# Patient Record
Sex: Female | Born: 1990 | Race: Black or African American | Hispanic: No | Marital: Single | Smoking: Former smoker
Health system: Southern US, Community
[De-identification: ages and names within clinical notes are randomized; demographics above are authoritative.]

## PROBLEM LIST (undated history)

## (undated) DIAGNOSIS — O139 Gestational [pregnancy-induced] hypertension without significant proteinuria, unspecified trimester: Secondary | ICD-10-CM

## (undated) DIAGNOSIS — N83209 Unspecified ovarian cyst, unspecified side: Secondary | ICD-10-CM

## (undated) DIAGNOSIS — Z789 Other specified health status: Secondary | ICD-10-CM

## (undated) DIAGNOSIS — E282 Polycystic ovarian syndrome: Secondary | ICD-10-CM

## (undated) HISTORY — PX: NO PAST SURGERIES: SHX2092

---
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
## 2018-05-26 ENCOUNTER — Encounter: Payer: Self-pay | Admitting: Endocrinology

## 2018-05-28 ENCOUNTER — Other Ambulatory Visit: Payer: Self-pay | Admitting: Internal Medicine

## 2018-05-28 DIAGNOSIS — E041 Nontoxic single thyroid nodule: Secondary | ICD-10-CM

## 2018-06-07 ENCOUNTER — Ambulatory Visit
Admission: RE | Admit: 2018-06-07 | Discharge: 2018-06-07 | Disposition: A | Discharge status: Home / Self Care | Payer: BLUE CROSS/BLUE SHIELD | Source: Ambulatory Visit | Attending: Internal Medicine | Admitting: Internal Medicine

## 2018-06-07 DIAGNOSIS — E041 Nontoxic single thyroid nodule: Secondary | ICD-10-CM

## 2018-08-22 NOTE — Progress Notes (Deleted)
Patient ID: Alexandra Steele, female   DOB: 05/04/1991, 27 y.o.   MRN: [id number]

## 2018-08-23 ENCOUNTER — Ambulatory Visit: Payer: BLUE CROSS/BLUE SHIELD | Admitting: Endocrinology

## 2018-09-06 NOTE — Progress Notes (Signed)
Patient ID: Alexandra Steele, female   DOB: 1991/07/19, 27 y.o.   MRN: [id number]          Reason for Appointment: Thyroid problem, new consultation    History of Present Illness:   Patient has been referred by Dreama Saa, PA-C  The patient's thyroid enlargement was first discovered in 6/19 when she was felt to have a thyroid nodule on exam  She has had no difficulty with swallowing  Does not feel like she has tightness or pressure sensation in the neck  She also has not had any unusual weight change, palpitations, shakiness or heat intolerance  She had thyroid levels done because of symptoms of mood swings and since her TSH was below the normal range at 0.37 she has been referred here for further management Total T4 was normal at 9.9 and free T3 3.4  Lab Results  Component Value Date   FREET4 1.09 09/07/2018   TSH 0.49 09/07/2018    She has had an ultrasound exam in 6/19 which did not show any significant abnormality except mild heterogenous texture    Allergies as of 09/07/2018   No Known Allergies     Medication List        Accurate as of 09/07/18  8:59 PM. Always use your most recent med list.          levonorgestrel-ethinyl estradiol tablet Commonly known as:  ENPRESSE,TRIVORA Take 1 tablet by mouth daily.   Vitamin D (Ergocalciferol) 50000 units Caps capsule Commonly known as:  DRISDOL TAKE 1 CAPSULE BY MOUTH TWICE A WEEK WITH A MEAL       Allergies: No Known Allergies  History reviewed. No pertinent past medical history.  History reviewed. No pertinent surgical history.  Family History  Problem Relation Age of Onset  . Hypertension Mother   . Diabetes Paternal Grandmother   . Thyroid disease Neg Hx     Social History:  reports that she quit smoking about 16 months ago. She has quit using smokeless tobacco. She reports that she drinks alcohol. She reports that she has current or past drug history. Drug: Marijuana.     Review of Systems    Constitutional: Negative for weight loss and reduced appetite.  HENT: Negative for trouble swallowing.   Respiratory: Negative for shortness of breath.   Cardiovascular: Negative for leg swelling.  Gastrointestinal: Negative for constipation.  Endocrine: Negative for fatigue and heat intolerance.       She has a relatively long menstrual cycles despite being regular on her birth control pill  Musculoskeletal: Negative for joint pain.  Neurological: Negative for weakness.      Examination:   BP 124/88   Pulse 76   Ht 5' 6.5" (1.689 m)   Wt 181 lb (82.1 kg)   LMP 08/14/2018   SpO2 98%   BMI 28.78 kg/m    General Appearance: pleasant, has mild generalized obesity         Eyes: No prominence or swelling.          Neck: The thyroid is not enlarged and there is no palpable abnormality. There is no lymphadenopathy.     Cardiovascular: Normal  heart sounds, no murmur Respiratory:  Lungs clear Abdomen shows no hepatosplenomegaly or other mass Neurological: REFLEXES: at biceps are normal.  Skin: no rash        Assessment/Plan:  Mildly decreased TSH as of 05/2018 with normal T4 and T3 levels Clinically asymptomatic No thyroid enlargement is palpable Also her thyroid  ultrasound did not show any abnormal size of the thyroid or nodules  Most likely she has a low normal TSH of no significance and will need to repeat thyroid functions today to confirm  A copy of the consultation note is sent to the referring physician  Alexandra Steele 09/07/2018  ADDENDUM: TSH is 0.49, normal.  Free T4 normal Since she is asymptomatic likely she has TSH levels at the low end of the range without any clinical significance and does not need any further evaluation  Alexandra LittlerAjay Daundre Steele

## 2018-09-07 ENCOUNTER — Ambulatory Visit (INDEPENDENT_AMBULATORY_CARE_PROVIDER_SITE_OTHER): Payer: BLUE CROSS/BLUE SHIELD | Admitting: Endocrinology

## 2018-09-07 ENCOUNTER — Encounter: Payer: Self-pay | Admitting: Endocrinology

## 2018-09-07 VITALS — BP 124/88 | HR 76 | Ht 66.5 in | Wt 181.0 lb

## 2018-09-07 DIAGNOSIS — R7989 Other specified abnormal findings of blood chemistry: Secondary | ICD-10-CM | POA: Diagnosis not present

## 2018-09-07 LAB — TSH: TSH: 0.49 u[IU]/mL (ref 0.35–4.50)

## 2018-09-07 LAB — T4, FREE: Free T4: 1.09 ng/dL (ref 0.60–1.60)

## 2019-07-07 IMAGING — US US THYROID
1 series · 14 of 25 positions shown · non-contrast
Comparison: None.

CLINICAL DATA: Palpable abnormality.  Thyroid nodule.

EXAM:
THYROID ULTRASOUND
TECHNIQUE: Ultrasound examination of the thyroid gland and adjacent soft
tissues was performed.

[Series 1: us thyroid · 0.07mm/px · 14 of 37 slices shown]
[im 1/37]
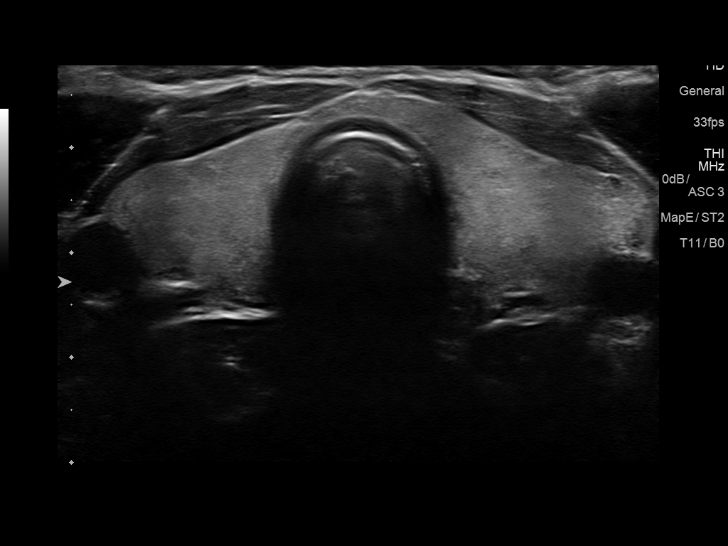
[im 4/37]
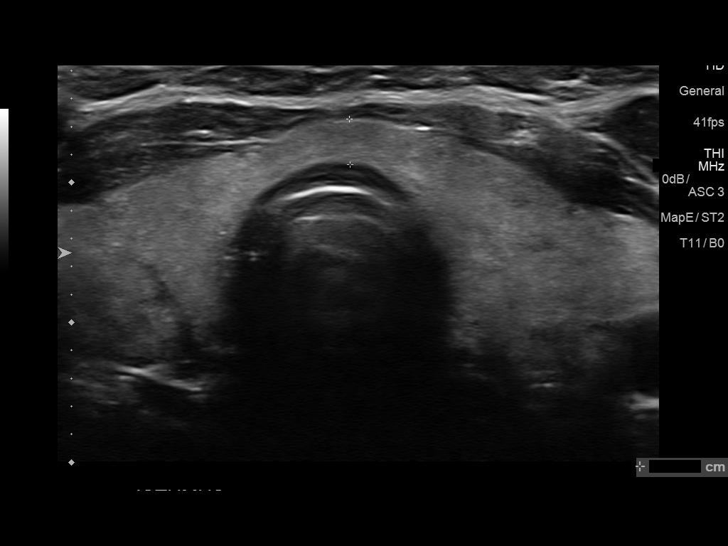
[im 7/37]
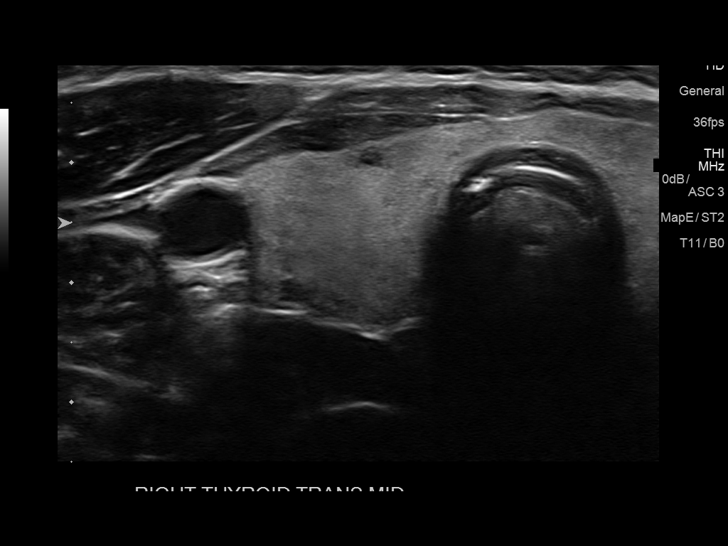
[im 10/37]
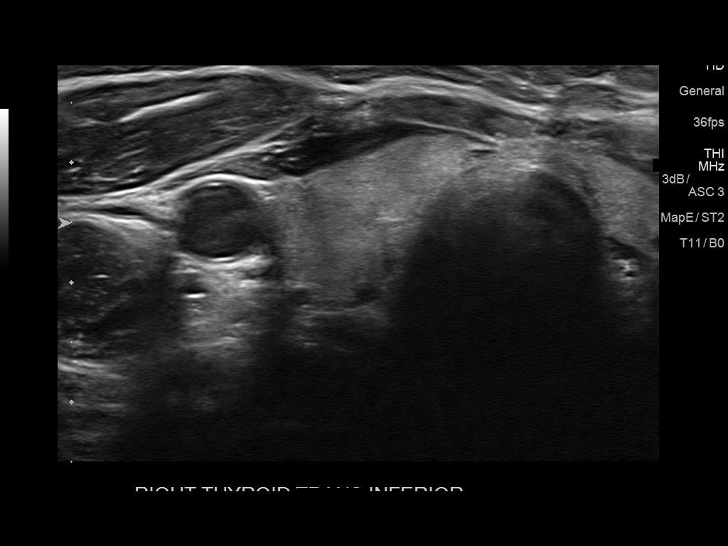
[im 13/37]
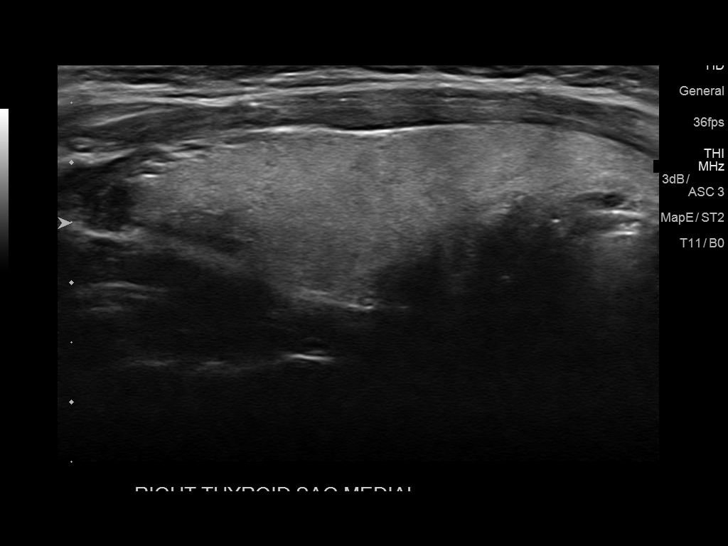
[im 14/37]
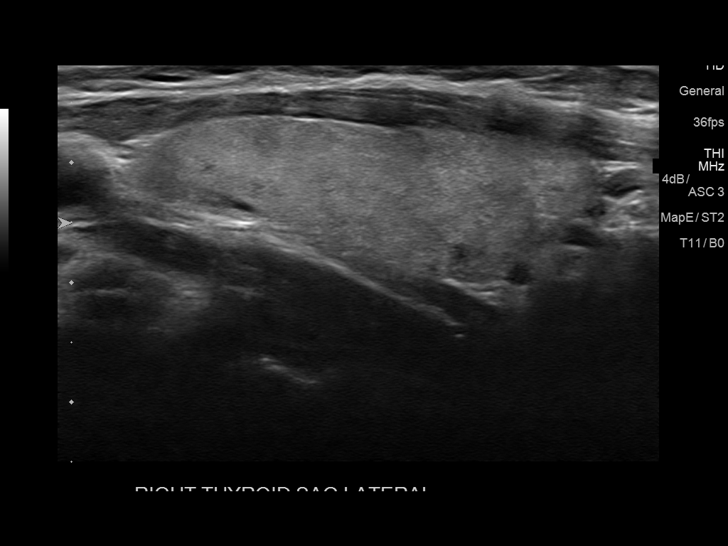
[im 17/37]
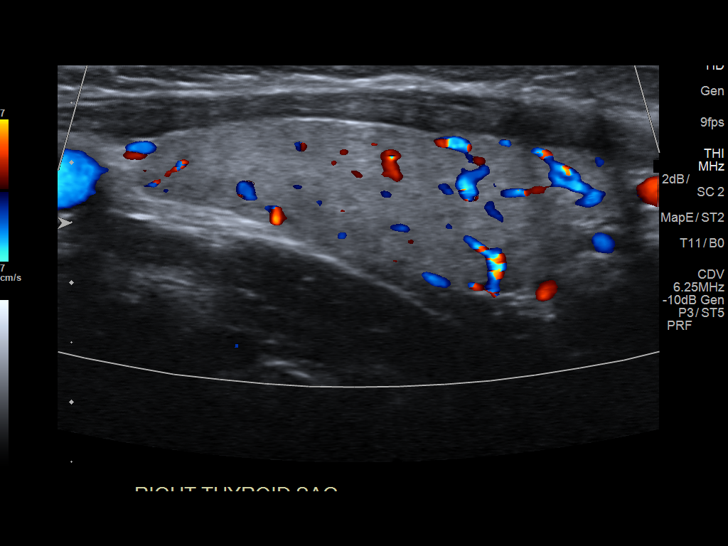
[im 20/37]
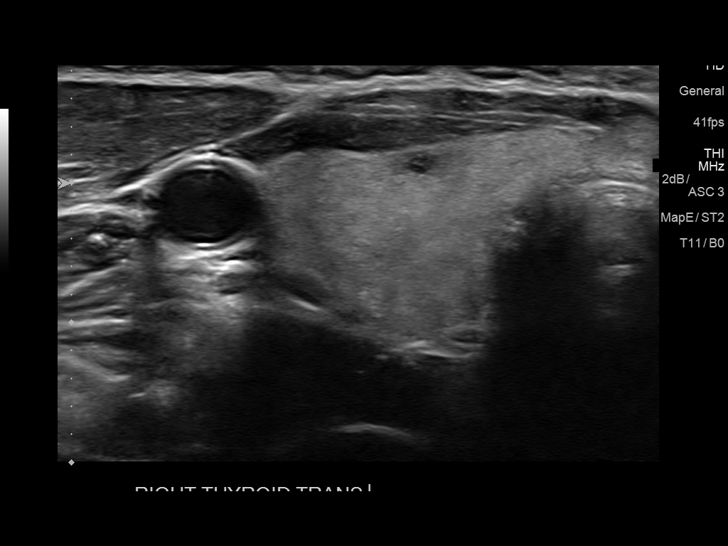
[im 23/37]
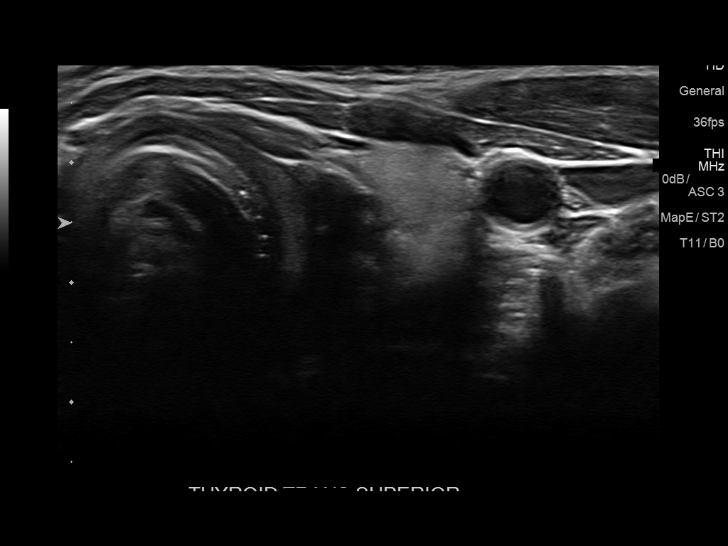
[im 25/37]
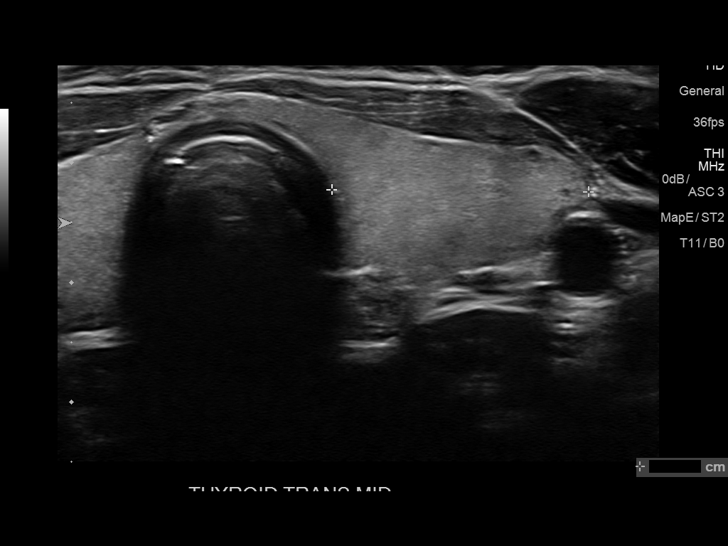
[im 28/37]
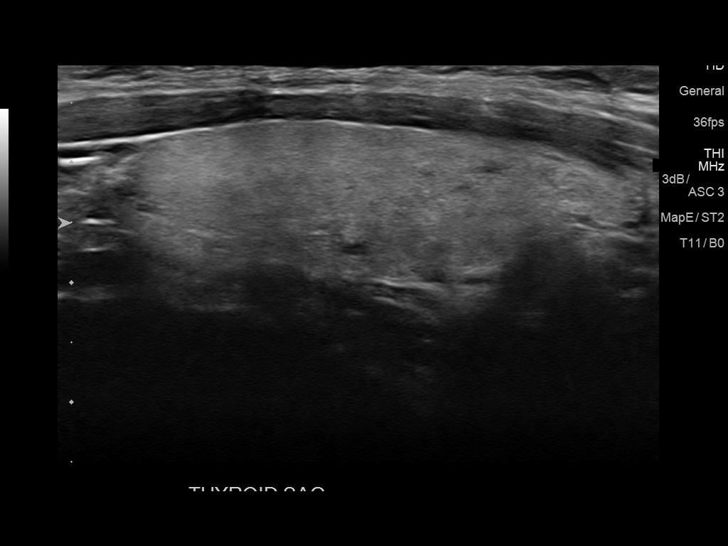
[im 31/37]
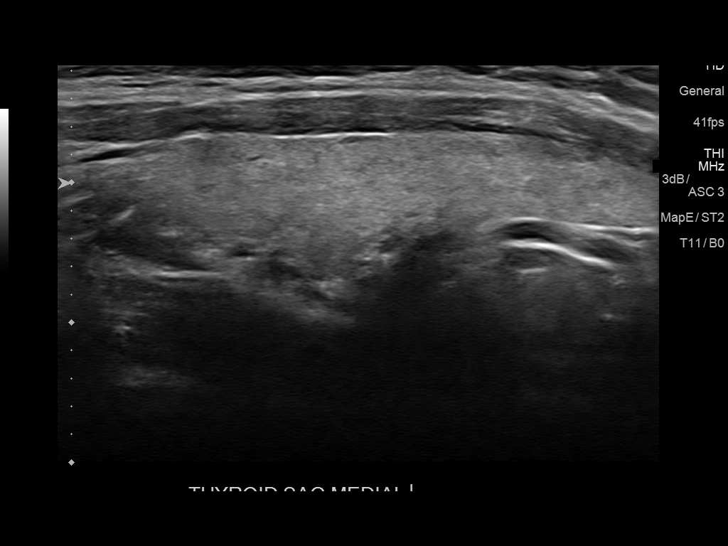
[im 34/37]
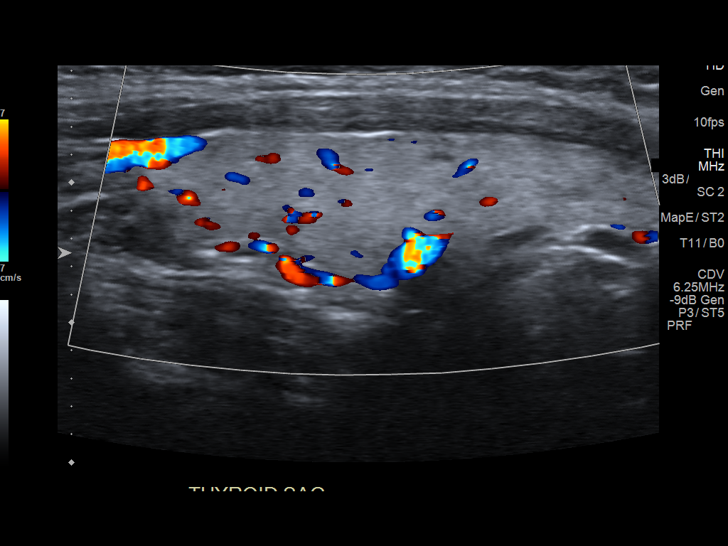
[im 37/37]
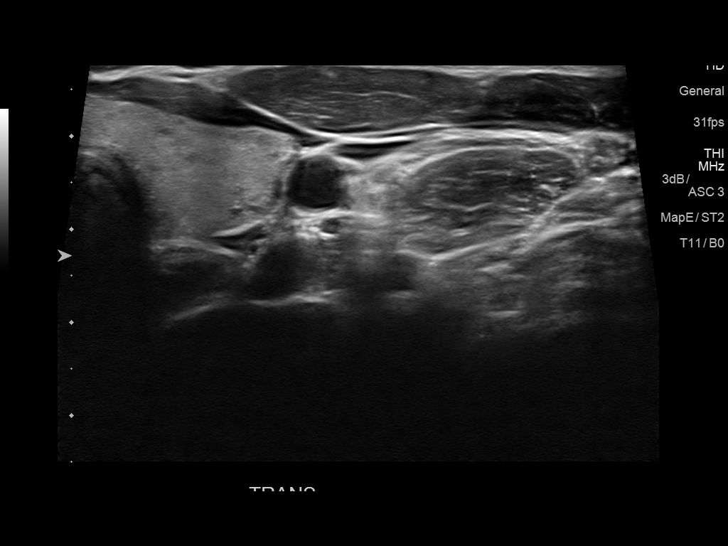

[14 of 25 positions shown; findings below may reference images not displayed]

FINDINGS: Parenchymal Echotexture: Mildly heterogenous

Isthmus: 0.3 cm

Right lobe: 4.6 x 1.7 x 1.8 cm

Left lobe: 4.3 x 1.3 x 2.1 cm

_________________________________________________________

Estimated total number of nodules >/= 1 cm: 0

Number of spongiform nodules >/=  2 cm not described below (TR1): 0

Number of mixed cystic and solid nodules >/= 1.5 cm not described
below (TR2): 0

_________________________________________________________

No discrete nodules are seen within the thyroid gland.
IMPRESSION: No discrete nodule.

The above is in keeping with the ACR TI-RADS recommendations - [HOSPITAL] 6852;[DATE].

## 2019-09-19 ENCOUNTER — Telehealth: Payer: Self-pay

## 2019-09-19 NOTE — Telephone Encounter (Signed)
Pt LVM asking for some drops to be called in for a sty on her eye that she has had for about 4 days. LVM for pt to call the office to be scheduled for an appt

## 2019-10-20 ENCOUNTER — Other Ambulatory Visit: Payer: Self-pay | Admitting: Internal Medicine

## 2019-12-28 ENCOUNTER — Other Ambulatory Visit: Payer: Self-pay | Admitting: Internal Medicine

## 2020-02-09 LAB — HM PAP SMEAR

## 2020-03-08 ENCOUNTER — Other Ambulatory Visit: Payer: Self-pay

## 2020-03-08 ENCOUNTER — Encounter: Payer: Self-pay | Admitting: Internal Medicine

## 2021-02-25 ENCOUNTER — Telehealth: Payer: Self-pay

## 2021-02-25 NOTE — Telephone Encounter (Signed)
LVM for pt to call the office to schedule an appt. °

## 2022-03-06 LAB — OB RESULTS CONSOLE RPR: RPR: NONREACTIVE

## 2022-03-06 LAB — OB RESULTS CONSOLE RUBELLA ANTIBODY, IGM: Rubella: IMMUNE

## 2022-03-06 LAB — OB RESULTS CONSOLE HEPATITIS B SURFACE ANTIGEN: Hepatitis B Surface Ag: NEGATIVE

## 2022-03-06 LAB — HEPATITIS C ANTIBODY: HCV Ab: NEGATIVE

## 2022-03-06 LAB — OB RESULTS CONSOLE HIV ANTIBODY (ROUTINE TESTING): HIV: NONREACTIVE

## 2022-03-12 LAB — OB RESULTS CONSOLE GC/CHLAMYDIA
Chlamydia: NEGATIVE
Neisseria Gonorrhea: NEGATIVE

## 2022-08-01 LAB — OB RESULTS CONSOLE GC/CHLAMYDIA
Chlamydia: NEGATIVE
Neisseria Gonorrhea: NEGATIVE

## 2022-08-01 LAB — OB RESULTS CONSOLE GBS: GBS: NEGATIVE

## 2022-08-22 ENCOUNTER — Other Ambulatory Visit: Payer: Self-pay | Admitting: Obstetrics and Gynecology

## 2022-08-22 ENCOUNTER — Ambulatory Visit: Payer: BC Managed Care – PPO | Admitting: *Deleted

## 2022-08-22 ENCOUNTER — Inpatient Hospital Stay (HOSPITAL_COMMUNITY)
Admission: AD | Admit: 2022-08-22 | Discharge: 2022-08-22 | Disposition: A | Discharge status: Home / Self Care | Payer: BC Managed Care – PPO | Source: Home / Self Care | Attending: Obstetrics and Gynecology | Admitting: Obstetrics and Gynecology

## 2022-08-22 ENCOUNTER — Ambulatory Visit (HOSPITAL_BASED_OUTPATIENT_CLINIC_OR_DEPARTMENT_OTHER): Payer: BC Managed Care – PPO

## 2022-08-22 ENCOUNTER — Other Ambulatory Visit: Payer: Self-pay

## 2022-08-22 ENCOUNTER — Ambulatory Visit (HOSPITAL_BASED_OUTPATIENT_CLINIC_OR_DEPARTMENT_OTHER): Payer: BC Managed Care – PPO | Admitting: Maternal & Fetal Medicine

## 2022-08-22 ENCOUNTER — Encounter (HOSPITAL_COMMUNITY): Payer: Self-pay | Admitting: Obstetrics and Gynecology

## 2022-08-22 VITALS — BP 140/84 | HR 66

## 2022-08-22 DIAGNOSIS — O43103 Malformation of placenta, unspecified, third trimester: Secondary | ICD-10-CM | POA: Diagnosis not present

## 2022-08-22 DIAGNOSIS — O365990 Maternal care for other known or suspected poor fetal growth, unspecified trimester, not applicable or unspecified: Secondary | ICD-10-CM

## 2022-08-22 DIAGNOSIS — Z363 Encounter for antenatal screening for malformations: Secondary | ICD-10-CM

## 2022-08-22 DIAGNOSIS — R030 Elevated blood-pressure reading, without diagnosis of hypertension: Secondary | ICD-10-CM

## 2022-08-22 DIAGNOSIS — Z3A34 34 weeks gestation of pregnancy: Secondary | ICD-10-CM

## 2022-08-22 DIAGNOSIS — O163 Unspecified maternal hypertension, third trimester: Secondary | ICD-10-CM | POA: Insufficient documentation

## 2022-08-22 DIAGNOSIS — O365930 Maternal care for other known or suspected poor fetal growth, third trimester, not applicable or unspecified: Secondary | ICD-10-CM

## 2022-08-22 DIAGNOSIS — Z3689 Encounter for other specified antenatal screening: Secondary | ICD-10-CM | POA: Insufficient documentation

## 2022-08-22 DIAGNOSIS — O283 Abnormal ultrasonic finding on antenatal screening of mother: Secondary | ICD-10-CM | POA: Insufficient documentation

## 2022-08-22 DIAGNOSIS — Z0130 Encounter for examination of blood pressure without abnormal findings: Secondary | ICD-10-CM | POA: Diagnosis not present

## 2022-08-22 HISTORY — DX: Other specified health status: Z78.9

## 2022-08-22 LAB — COMPREHENSIVE METABOLIC PANEL
ALT: 13 U/L (ref 0–44)
AST: 17 U/L (ref 15–41)
Albumin: 3 g/dL — ABNORMAL LOW (ref 3.5–5.0)
Alkaline Phosphatase: 139 U/L — ABNORMAL HIGH (ref 38–126)
Anion gap: 9 (ref 5–15)
BUN: 14 mg/dL (ref 6–20)
CO2: 20 mmol/L — ABNORMAL LOW (ref 22–32)
Calcium: 9.1 mg/dL (ref 8.9–10.3)
Chloride: 107 mmol/L (ref 98–111)
Creatinine, Ser: 0.77 mg/dL (ref 0.44–1.00)
GFR, Estimated: >60 mL/min (ref >60)
Glucose, Bld: 74 mg/dL (ref 70–99)
Potassium: 4.2 mmol/L (ref 3.5–5.1)
Sodium: 136 mmol/L (ref 135–145)
Total Bilirubin: 0.4 mg/dL (ref 0.3–1.2)
Total Protein: 6.1 g/dL — ABNORMAL LOW (ref 6.5–8.1)

## 2022-08-22 LAB — CBC
HCT: 35.3 % — ABNORMAL LOW (ref 36.0–46.0)
Hemoglobin: 11.9 g/dL — ABNORMAL LOW (ref 12.0–15.0)
MCH: 29.8 pg (ref 26.0–34.0)
MCHC: 33.7 g/dL (ref 30.0–36.0)
MCV: 88.5 fL (ref 80.0–100.0)
Platelets: 255 10*3/uL (ref 150–400)
RBC: 3.99 MIL/uL (ref 3.87–5.11)
RDW: 11.8 % (ref 11.5–15.5)
WBC: 12.7 10*3/uL — ABNORMAL HIGH (ref 4.0–10.5)
nRBC: 0.2 % (ref 0.0–0.2)

## 2022-08-22 LAB — PROTEIN / CREATININE RATIO, URINE
Creatinine, Urine: 178 mg/dL
Protein Creatinine Ratio: 0.09 mg/mg{Cre} (ref 0.00–0.15)
Total Protein, Urine: 16 mg/dL

## 2022-08-22 MED ORDER — BETAMETHASONE SOD PHOS & ACET 6 (3-3) MG/ML IJ SUSP
12.0000 mg | Freq: Once | INTRAMUSCULAR | Status: AC
  Administered 2022-08-22: 12 mg via INTRAMUSCULAR
  Filled 2022-08-22: qty 5

## 2022-08-22 NOTE — Progress Notes (Addendum)
MFM Consult Note Patient Name: Alexandra Steele  Patient MRN:   [id number]  Referring provider: [facility]  Reason for Consult: placental lesion/abruption, elevated BP and FGR   HPI: Alexandra Steele is a 31 y.o. G1P0 at [redacted]w[redacted]d by [redacted]w[redacted]d [place]. Here for ultrasound and consultation.   RE placental lesion: The placental lesion was noted on the referring facility ultrasound.  The concerning area today is suggestive of a chronic abruption.  It appears there are mixed echogenicities measuring about 4.4 x 6.6 cm.  The there is no evidence of active bleeding.  The remainder of the placenta appears heavily calcified.  I discussed the clinical significance of the placental abruption.  I also discussed this clinical diagnosis and that ultrasound is indicated the diagnosis but should not be relied on clinical decision making.  She has moderate cramping throughout the day but denies overt contractions.  She denies vaginal bleeding.  RE fetal growth restriction: The fetus is growth restricted at today with the estimated fetal weight measuring 2.4% and the abdominal circumference less than the first percentile.  Umbilical artery Dopplers are normal.  I discussed the various causes of fetal growth restriction including but not limited to genetic, infectious, placental and maternal chronic disease.  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  In addition to her elevated blood pressure I discussed the coincidence of preeclampsia and fetal growth restriction.  Discussed the importance of going to the hospital for prolonged monitoring and for blood pressure assessment and additional laboratory work.  I discussed that if she is discharged we will perform weekly ultrasounds including umbilical artery Dopplers, BPP and NST.  RE Elevated blood pressure: I discussed her new onset finding of elevated blood pressure today.  She denies headache, vision changes, right upper quadrant pain  or excessive swelling.  I discussed my concerns for possible preeclampsia versus gestational hypertension.  I discussed the management if she were to be diagnosed with either condition, specifically early delivery.  Review of Systems: A review of systems was performed and was negative except per HPI   Vitals Blood pressure: 139/86 with repeat of 140/84, Prepregnancy BMI: 23  Genetic testing: low risk   Assessment Fetal growth restriction (2.4% overall, <1% AC, normal dopplers) Possible placental abruption Elevated blood pressure Non-reassuring fetal status (BPP 6/8, -2 fetal movement)  Plan Sent to MAU for prolonged fetal monitoring, serial blood pressure assessments and preeclampsia labs. If she is discharged I have scheduled her follow-up  I spent 45 minutes reviewing the patients chart, including labs and images as well as counseling the patient about her medical conditions.  [name]  MFM, [facility]   08/22/2022  3:56 PM

## 2022-08-22 NOTE — MAU Note (Signed)
.  Alexandra Steele is a 31 y.o. at [redacted]w[redacted]d here in MAU reporting: sent over from [facility] office for NST and BP assessment. Patient reports that OB saw something abnormal on her uterus during [place] today. Paper from [facility] office says follow up for "possible abruption on [place]" and "BP assessment". Reports some lower abdominal cramping 5/10. Denies HA, visual changes, RUQ/epigastric pain, or abnormal swelling. Denies VB or LOF. Reports +FM.  LMP: N/A Onset of complaint: today Pain score: 5/10 Vitals:   08/22/22 1625  BP: (!) 153/89  Pulse: 78  Resp: 20  Temp: 98.7 F (37.1 C)  SpO2: 98%     FHT:145 Lab orders placed from triage:  UA

## 2022-08-22 NOTE — MAU Provider Note (Signed)
History     CSN: [id number]  Arrival date and time: 08/22/22 1605   Event Date/Time   First Provider Initiated Contact with Patient 08/22/22 1630      Chief Complaint  Patient presents with   Hypertension   Non-stress Test   HPI Lashone Stauber is a 31 y.o. G1P0 at [redacted]w[redacted]d who presents to MAU from MFM for evaluation of new onset elevated blood pressure. Patient's pregnancy is complicated by FGR and chronic abruption. On arrival to MAU she reports decreased fetal movement. She denies contractions, vaginal bleeding, headache, visual disturbances, RUQ/epigastric pain, new onset swelling or weight gain.  Patient receives care with [facility] and her next appointment is 09/08/2022. She is scheduled with MFM next week.  OB History     Gravida  1   Para      Term      Preterm      AB      Living         SAB      IAB      Ectopic      Multiple      Live Births              Past Medical History:  Diagnosis Date   Medical history non-contributory     Past Surgical History:  Procedure Laterality Date   NO PAST SURGERIES      Family History  Problem Relation Age of Onset   Hypertension Mother    Dementia Mother    Diabetes Paternal Grandmother    Thyroid disease Neg Hx     Social History   Tobacco Use   Smoking status: Former    Types: Cigarettes    Quit date: 05/07/2017    Years since quitting: 5.2   Smokeless tobacco: Former  Substance Use Topics   Alcohol use: Not Currently    Comment: social   Drug use: Not Currently    Types: Marijuana    Allergies: No Known Allergies  Medications Prior to Admission  Medication Sig Dispense Refill Last Dose   ferrous sulfate 325 (65 FE) MG tablet Take 325 mg by mouth daily with breakfast.   08/22/2022   ondansetron (ZOFRAN) 4 MG tablet Take 4 mg by mouth every 8 (eight) hours as needed for nausea or vomiting.   08/22/2022   prenatal vitamin w/FE, FA (PRENATAL 1 + 1) 27-1 MG TABS tablet Take by mouth daily at 12  noon.   08/22/2022   Vitamin D, Ergocalciferol, (DRISDOL) 1.25 MG (50000 UT) CAPS capsule TAKE 1 CAPSULE BY MOUTH TWICE A WEEK WITH A MEAL 24 capsule 0 08/22/2022    Review of Systems  All other systems reviewed and are negative.  Physical Exam   Blood pressure (!) 140/82, pulse 67, temperature 98.7 F (37.1 C), temperature source Oral, resp. rate 20, height 5\' 6"  (1.676 m), weight 97.4 kg, SpO2 98 %.  Physical Exam Vitals and nursing note reviewed. Exam conducted with a chaperone present.  Constitutional:      Appearance: Normal appearance. She is not ill-appearing.  Cardiovascular:     Rate and Rhythm: Normal rate and regular rhythm.     Pulses: Normal pulses.     Heart sounds: Normal heart sounds.  Pulmonary:     Effort: Pulmonary effort is normal.     Breath sounds: Normal breath sounds.  Abdominal:     Comments: Gravid  Skin:    Capillary Refill: Capillary refill takes less than 2 seconds.  Neurological:  Mental Status: She is alert and oriented to person, place, and time.  Psychiatric:        Mood and Affect: Mood normal.        Behavior: Behavior normal.        Thought Content: Thought content normal.        Judgment: Judgment normal.    MAU Course  Procedures  MDM  --EMR reviewed. No hx of HTN --First elevated reading in MFM today at 1330 --Patient does not yet have 4 hours between mildly elevated BPs. Asymptomatic throughout MAU encounter --Reactive tracing: baseline 145, mod var, + accels, no decels --BPP 6/8 in MFM today, off for fetal movement --Discussed workup and findings with Dr. Alysia Penna. He advises BMZ, agrees patient is stable for discharge home but needs earlier than 09/25 for follow-up in office.  --Dr. Su Hilt called at [phone]. She agrees with plan of care. Per Dr. Su Hilt, I will call Rhea Pink, CNM and ask for her help coordinating an office appointment this Monday 09/11 --Rhea Pink reached at [phone] to assist with office appointment  Orders  Placed This Encounter  Procedures   Protein / creatinine ratio, urine   CBC   Comprehensive metabolic panel   Measure blood pressure   Discharge patient   Patient Vitals for the past 24 hrs:  BP Temp Temp src Pulse Resp SpO2 Height Weight  08/22/22 1745 132/74 -- -- 61 -- 100 % -- --  08/22/22 1730 119/63 -- -- 67 -- 100 % -- --  08/22/22 1715 (!) 117/94 -- -- (!) 157 -- 99 % -- --  08/22/22 1700 135/78 -- -- 67 -- 98 % -- --  08/22/22 1645 (!) 142/97 -- -- 66 -- 98 % -- --  08/22/22 1630 (!) 140/82 -- -- 67 -- 98 % -- --  08/22/22 1625 (!) 153/89 98.7 F (37.1 C) Oral 78 20 98 % 5\' 6"  (1.676 m) 97.4 kg   Results for orders placed or performed during the hospital encounter of 08/22/22 (from the past 24 hour(s))  Protein / creatinine ratio, urine     Status: None   Collection Time: 08/22/22  4:40 PM  Result Value Ref Range   Creatinine, Urine 178 mg/dL   Total Protein, Urine 16 mg/dL   Protein Creatinine Ratio 0.09 0.00 - 0.15 mg/mg[Cre]  CBC     Status: Abnormal   Collection Time: 08/22/22  5:05 PM  Result Value Ref Range   WBC 12.7 (H) 4.0 - 10.5 K/uL   RBC 3.99 3.87 - 5.11 MIL/uL   Hemoglobin 11.9 (L) 12.0 - 15.0 g/dL   HCT 10/22/22 (L) 15.4 - 00.8 %   MCV 88.5 80.0 - 100.0 fL   MCH 29.8 26.0 - 34.0 pg   MCHC 33.7 30.0 - 36.0 g/dL   RDW 67.6 19.5 - 09.3 %   Platelets 255 150 - 400 K/uL   nRBC 0.2 0.0 - 0.2 %  Comprehensive metabolic panel     Status: Abnormal   Collection Time: 08/22/22  5:05 PM  Result Value Ref Range   Sodium 136 135 - 145 mmol/L   Potassium 4.2 3.5 - 5.1 mmol/L   Chloride 107 98 - 111 mmol/L   CO2 20 (L) 22 - 32 mmol/L   Glucose, Bld 74 70 - 99 mg/dL   BUN 14 6 - 20 mg/dL   Creatinine, Ser 10/22/22 0.44 - 1.00 mg/dL   Calcium 9.1 8.9 - 1.24 mg/dL   Total Protein 6.1 (L) 6.5 -  8.1 g/dL   Albumin 3.0 (L) 3.5 - 5.0 g/dL   AST 17 15 - 41 U/L   ALT 13 0 - 44 U/L   Alkaline Phosphatase 139 (H) 38 - 126 U/L   Total Bilirubin 0.4 0.3 - 1.2 mg/dL   GFR,  Estimated >17 >79 mL/min   Anion gap 9 5 - 15   Assessment and Plan  --31 y.o. G1P0 at [redacted]w[redacted]d  --FGR per MFM --New onset elevated BP not yet 4 hours between readings --Asymptomatic, PEC labs WNL --BMZ 1 of 2 given in MAU at 1648 --Care coordinated with Dr. Alysia Penna and Dr. Su Hilt --Discharge home in stable condition with return precautions  F/U: --Patient to return to MAU tomorrow 09/09 after 1630 for BMZ 2 of 2 --Per Dr. Su Hilt, patient to be worked onto office schedule Monday 09/11  Calvert Cantor, MSA, MSN, CNM

## 2022-08-23 ENCOUNTER — Inpatient Hospital Stay (EMERGENCY_DEPARTMENT_HOSPITAL)
Admission: AD | Admit: 2022-08-23 | Discharge: 2022-08-23 | Disposition: A | Discharge status: Home / Self Care | Payer: BC Managed Care – PPO | Source: Home / Self Care | Attending: Obstetrics and Gynecology | Admitting: Obstetrics and Gynecology

## 2022-08-23 DIAGNOSIS — O26893 Other specified pregnancy related conditions, third trimester: Secondary | ICD-10-CM | POA: Insufficient documentation

## 2022-08-23 DIAGNOSIS — Z0130 Encounter for examination of blood pressure without abnormal findings: Secondary | ICD-10-CM | POA: Diagnosis not present

## 2022-08-23 DIAGNOSIS — Z3A34 34 weeks gestation of pregnancy: Secondary | ICD-10-CM | POA: Insufficient documentation

## 2022-08-23 DIAGNOSIS — R42 Dizziness and giddiness: Secondary | ICD-10-CM | POA: Insufficient documentation

## 2022-08-23 MED ORDER — BETAMETHASONE SOD PHOS & ACET 6 (3-3) MG/ML IJ SUSP
12.0000 mg | Freq: Once | INTRAMUSCULAR | Status: AC
  Administered 2022-08-23: 12 mg via INTRAMUSCULAR

## 2022-08-23 NOTE — MAU Provider Note (Signed)
Event Date/Time   First Provider Initiated Contact with Patient 08/23/22 1726     S Ms. Mumtaz Lovins is a 31 y.o. G1P0 at [redacted]w[redacted]d who presents to MAU today for  2nd dose of BMZ and BP check. Patient reports she had a headache earlier today, however it do away without intervention. She does not currently have a headache. She denies vision changes or RUQ/epigastric pain. No regular contractions, vaginal bleeding, or leaking fluid. Endorses normal fetal movement. Patient reports while sitting in the waiting room she started to feel dizzy. Both she and husband think that she stood up to fast. Since sitting in triage, symptoms have resolved. No chest pain, palpitations, or shortness of breath. She reports normal meals, snacks, and adequate hydration today.   O BP 136/74   Pulse 73   Temp 98.1 F (36.7 C)   Resp 18   Physical Exam Vitals and nursing note reviewed.  Constitutional:      General: She is not in acute distress. Eyes:     Pupils: Pupils are equal, round, and reactive to light.  Cardiovascular:     Rate and Rhythm: Normal rate.  Pulmonary:     Effort: Pulmonary effort is normal.  Abdominal:     Palpations: Abdomen is soft.     Tenderness: There is no abdominal tenderness.     Comments: gravid  Musculoskeletal:        General: Normal range of motion.     Cervical back: Normal range of motion.  Skin:    General: Skin is warm and dry.  Neurological:     General: No focal deficit present.     Mental Status: She is alert and oriented to person, place, and time.  Psychiatric:        Mood and Affect: Mood normal.        Behavior: Behavior normal.        Judgment: Judgment normal.    FHR: 156 bpm via doppler  A Medical screening exam complete [redacted] weeks gestation of pregnancy BP check BMZ injection #2 given Orthostatic vital signs   P Discharge from MAU in stable condition BP normotensive. Patient asymptomatic. Orthostatics performed given brief sudden onset of  dizziness that occurred while in lobby. Orthostatics were normal. She is currently not feeling dizzy. Likely orthostatic given that she stood up quickly. Warning signs for worsening condition that would warrant emergency follow-up discussed Patient may return to MAU as needed  Keep OB appointment as scheduled on Monday 9/11    Brand Males, [place] 08/23/2022 6:04 PM

## 2022-08-23 NOTE — MAU Note (Addendum)
Pt here for second Betamethasone injection.  Pt denies any cramping or discomfort.  When pt was sitting in lobby started to feel dizzy. Still feels dizzy in triage. Will notify provider.

## 2022-08-25 ENCOUNTER — Inpatient Hospital Stay (HOSPITAL_COMMUNITY)
Admission: AD | Admit: 2022-08-25 | Discharge: 2022-08-31 | DRG: 786 | Disposition: A | Discharge status: Home / Self Care | Payer: BC Managed Care – PPO | Attending: Obstetrics & Gynecology | Admitting: Obstetrics & Gynecology

## 2022-08-25 ENCOUNTER — Other Ambulatory Visit: Payer: Self-pay | Admitting: *Deleted

## 2022-08-25 ENCOUNTER — Encounter (HOSPITAL_COMMUNITY): Payer: Self-pay | Admitting: Obstetrics and Gynecology

## 2022-08-25 ENCOUNTER — Other Ambulatory Visit: Payer: Self-pay

## 2022-08-25 DIAGNOSIS — O26893 Other specified pregnancy related conditions, third trimester: Secondary | ICD-10-CM | POA: Diagnosis present

## 2022-08-25 DIAGNOSIS — O365931 Maternal care for other known or suspected poor fetal growth, third trimester, fetus 1: Secondary | ICD-10-CM | POA: Diagnosis not present

## 2022-08-25 DIAGNOSIS — Z3A35 35 weeks gestation of pregnancy: Secondary | ICD-10-CM | POA: Diagnosis not present

## 2022-08-25 DIAGNOSIS — O365930 Maternal care for other known or suspected poor fetal growth, third trimester, not applicable or unspecified: Secondary | ICD-10-CM | POA: Diagnosis present

## 2022-08-25 DIAGNOSIS — Z6791 Unspecified blood type, Rh negative: Secondary | ICD-10-CM

## 2022-08-25 DIAGNOSIS — D62 Acute posthemorrhagic anemia: Secondary | ICD-10-CM | POA: Diagnosis not present

## 2022-08-25 DIAGNOSIS — O365990 Maternal care for other known or suspected poor fetal growth, unspecified trimester, not applicable or unspecified: Secondary | ICD-10-CM

## 2022-08-25 DIAGNOSIS — O9081 Anemia of the puerperium: Secondary | ICD-10-CM | POA: Diagnosis not present

## 2022-08-25 DIAGNOSIS — O4593 Premature separation of placenta, unspecified, third trimester: Secondary | ICD-10-CM | POA: Diagnosis present

## 2022-08-25 DIAGNOSIS — O4590 Premature separation of placenta, unspecified, unspecified trimester: Secondary | ICD-10-CM | POA: Diagnosis present

## 2022-08-25 DIAGNOSIS — O133 Gestational [pregnancy-induced] hypertension without significant proteinuria, third trimester: Secondary | ICD-10-CM | POA: Diagnosis present

## 2022-08-25 DIAGNOSIS — O1414 Severe pre-eclampsia complicating childbirth: Secondary | ICD-10-CM | POA: Diagnosis present

## 2022-08-25 DIAGNOSIS — O1410 Severe pre-eclampsia, unspecified trimester: Secondary | ICD-10-CM | POA: Diagnosis present

## 2022-08-25 DIAGNOSIS — Z98891 History of uterine scar from previous surgery: Secondary | ICD-10-CM

## 2022-08-25 DIAGNOSIS — Z87891 Personal history of nicotine dependence: Secondary | ICD-10-CM

## 2022-08-25 DIAGNOSIS — Z3490 Encounter for supervision of normal pregnancy, unspecified, unspecified trimester: Secondary | ICD-10-CM | POA: Diagnosis not present

## 2022-08-25 DIAGNOSIS — O1493 Unspecified pre-eclampsia, third trimester: Secondary | ICD-10-CM | POA: Diagnosis not present

## 2022-08-25 HISTORY — DX: Unspecified ovarian cyst, unspecified side: N83.209

## 2022-08-25 HISTORY — DX: Gestational (pregnancy-induced) hypertension without significant proteinuria, unspecified trimester: O13.9

## 2022-08-25 HISTORY — DX: Polycystic ovarian syndrome: E28.2

## 2022-08-25 LAB — CBC
HCT: 33.5 % — ABNORMAL LOW (ref 36.0–46.0)
Hemoglobin: 10.9 g/dL — ABNORMAL LOW (ref 12.0–15.0)
MCH: 29.3 pg (ref 26.0–34.0)
MCHC: 32.5 g/dL (ref 30.0–36.0)
MCV: 90.1 fL (ref 80.0–100.0)
Platelets: 278 10*3/uL (ref 150–400)
RBC: 3.72 MIL/uL — ABNORMAL LOW (ref 3.87–5.11)
RDW: 11.9 % (ref 11.5–15.5)
WBC: 20.1 10*3/uL — ABNORMAL HIGH (ref 4.0–10.5)
nRBC: 1.5 % — ABNORMAL HIGH (ref 0.0–0.2)

## 2022-08-25 LAB — COMPREHENSIVE METABOLIC PANEL
ALT: 59 U/L — ABNORMAL HIGH (ref 0–44)
AST: 54 U/L — ABNORMAL HIGH (ref 15–41)
Albumin: 2.6 g/dL — ABNORMAL LOW (ref 3.5–5.0)
Alkaline Phosphatase: 128 U/L — ABNORMAL HIGH (ref 38–126)
Anion gap: 7 (ref 5–15)
BUN: 14 mg/dL (ref 6–20)
CO2: 20 mmol/L — ABNORMAL LOW (ref 22–32)
Calcium: 8.6 mg/dL — ABNORMAL LOW (ref 8.9–10.3)
Chloride: 112 mmol/L — ABNORMAL HIGH (ref 98–111)
Creatinine, Ser: 0.94 mg/dL (ref 0.44–1.00)
GFR, Estimated: >60 mL/min (ref >60)
Glucose, Bld: 100 mg/dL — ABNORMAL HIGH (ref 70–99)
Potassium: 3.8 mmol/L (ref 3.5–5.1)
Sodium: 139 mmol/L (ref 135–145)
Total Bilirubin: 0.6 mg/dL (ref 0.3–1.2)
Total Protein: 5.5 g/dL — ABNORMAL LOW (ref 6.5–8.1)

## 2022-08-25 LAB — PROTEIN / CREATININE RATIO, URINE
Creatinine, Urine: 81 mg/dL
Total Protein, Urine: <6 mg/dL

## 2022-08-25 MED ORDER — PRENATAL MULTIVITAMIN CH: 1.0000 | ORAL_TABLET | Freq: Every day | ORAL | Status: DC

## 2022-08-25 MED ORDER — SODIUM CHLORIDE 0.9% FLUSH: 3.0000 mL | INTRAVENOUS | Status: DC | PRN

## 2022-08-25 MED ORDER — CALCIUM CARBONATE ANTACID 500 MG PO CHEW: 2.0000 | CHEWABLE_TABLET | ORAL | Status: DC | PRN

## 2022-08-25 MED ORDER — SODIUM CHLORIDE 0.9 % IV SOLN: 250.0000 mL | INTRAVENOUS | Status: DC | PRN

## 2022-08-25 MED ORDER — LACTATED RINGERS IV SOLN: INTRAVENOUS | Status: DC

## 2022-08-25 MED ORDER — DOCUSATE SODIUM 100 MG PO CAPS: 100.0000 mg | ORAL_CAPSULE | Freq: Every day | ORAL | Status: DC

## 2022-08-25 MED ORDER — ZOLPIDEM TARTRATE 5 MG PO TABS: 5.0000 mg | ORAL_TABLET | Freq: Every evening | ORAL | Status: DC | PRN

## 2022-08-25 MED ORDER — SODIUM CHLORIDE 0.9% FLUSH
3.0000 mL | Freq: Two times a day (BID) | INTRAVENOUS | Status: DC
  Administered 2022-08-25: 3 mL via INTRAVENOUS

## 2022-08-25 MED ORDER — ACETAMINOPHEN 325 MG PO TABS
650.0000 mg | ORAL_TABLET | ORAL | Status: DC | PRN
  Administered 2022-08-26: 650 mg via ORAL
  Filled 2022-08-25: qty 2

## 2022-08-25 MED ORDER — LACTATED RINGERS IV BOLUS
500.0000 mL | Freq: Once | INTRAVENOUS | Status: AC
  Administered 2022-08-26: 500 mL via INTRAVENOUS

## 2022-08-26 ENCOUNTER — Inpatient Hospital Stay (HOSPITAL_BASED_OUTPATIENT_CLINIC_OR_DEPARTMENT_OTHER): Payer: BC Managed Care – PPO

## 2022-08-26 ENCOUNTER — Encounter (HOSPITAL_COMMUNITY): Payer: Self-pay | Admitting: Obstetrics & Gynecology

## 2022-08-26 ENCOUNTER — Other Ambulatory Visit: Payer: Self-pay

## 2022-08-26 DIAGNOSIS — O365930 Maternal care for other known or suspected poor fetal growth, third trimester, not applicable or unspecified: Principal | ICD-10-CM

## 2022-08-26 DIAGNOSIS — O365990 Maternal care for other known or suspected poor fetal growth, unspecified trimester, not applicable or unspecified: Secondary | ICD-10-CM

## 2022-08-26 DIAGNOSIS — Z3A35 35 weeks gestation of pregnancy: Secondary | ICD-10-CM

## 2022-08-26 DIAGNOSIS — O4593 Premature separation of placenta, unspecified, third trimester: Secondary | ICD-10-CM

## 2022-08-26 DIAGNOSIS — O1493 Unspecified pre-eclampsia, third trimester: Secondary | ICD-10-CM

## 2022-08-26 DIAGNOSIS — O1410 Severe pre-eclampsia, unspecified trimester: Secondary | ICD-10-CM | POA: Diagnosis present

## 2022-08-26 LAB — CBC WITH DIFFERENTIAL/PLATELET
Abs Immature Granulocytes: 1.75 10*3/uL — ABNORMAL HIGH (ref 0.00–0.07)
Basophils Absolute: 0.2 10*3/uL — ABNORMAL HIGH (ref 0.0–0.1)
Basophils Relative: 1 %
Eosinophils Absolute: 0.2 10*3/uL (ref 0.0–0.5)
Eosinophils Relative: 1 %
HCT: 35.5 % — ABNORMAL LOW (ref 36.0–46.0)
Hemoglobin: 11.6 g/dL — ABNORMAL LOW (ref 12.0–15.0)
Immature Granulocytes: 8 %
Lymphocytes Relative: 19 %
Lymphs Abs: 4.3 10*3/uL — ABNORMAL HIGH (ref 0.7–4.0)
MCH: 29.7 pg (ref 26.0–34.0)
MCHC: 32.7 g/dL (ref 30.0–36.0)
MCV: 91 fL (ref 80.0–100.0)
Monocytes Absolute: 1.4 10*3/uL — ABNORMAL HIGH (ref 0.1–1.0)
Monocytes Relative: 6 %
Neutro Abs: 14.9 10*3/uL — ABNORMAL HIGH (ref 1.7–7.7)
Neutrophils Relative %: 65 %
Platelets: 291 10*3/uL (ref 150–400)
RBC: 3.9 MIL/uL (ref 3.87–5.11)
RDW: 12 % (ref 11.5–15.5)
WBC: 22.7 10*3/uL — ABNORMAL HIGH (ref 4.0–10.5)
nRBC: 1.9 % — ABNORMAL HIGH (ref 0.0–0.2)

## 2022-08-26 LAB — COMPREHENSIVE METABOLIC PANEL
ALT: 62 U/L — ABNORMAL HIGH (ref 0–44)
AST: 50 U/L — ABNORMAL HIGH (ref 15–41)
Albumin: 2.6 g/dL — ABNORMAL LOW (ref 3.5–5.0)
Alkaline Phosphatase: 140 U/L — ABNORMAL HIGH (ref 38–126)
Anion gap: 7 (ref 5–15)
BUN: 11 mg/dL (ref 6–20)
CO2: 22 mmol/L (ref 22–32)
Calcium: 8.7 mg/dL — ABNORMAL LOW (ref 8.9–10.3)
Chloride: 111 mmol/L (ref 98–111)
Creatinine, Ser: 0.85 mg/dL (ref 0.44–1.00)
GFR, Estimated: >60 mL/min (ref >60)
Glucose, Bld: 80 mg/dL (ref 70–99)
Potassium: 3.8 mmol/L (ref 3.5–5.1)
Sodium: 140 mmol/L (ref 135–145)
Total Bilirubin: 0.8 mg/dL (ref 0.3–1.2)
Total Protein: 5.8 g/dL — ABNORMAL LOW (ref 6.5–8.1)

## 2022-08-26 LAB — PROTEIN / CREATININE RATIO, URINE
Creatinine, Urine: 16 mg/dL
Total Protein, Urine: <6 mg/dL

## 2022-08-26 LAB — RPR: RPR Ser Ql: NONREACTIVE

## 2022-08-26 LAB — MAGNESIUM: Magnesium: 5.3 mg/dL — ABNORMAL HIGH (ref 1.7–2.4)

## 2022-08-26 LAB — PREPARE RBC (CROSSMATCH)

## 2022-08-26 MED ORDER — LABETALOL HCL 5 MG/ML IV SOLN: 40.0000 mg | INTRAVENOUS | Status: DC | PRN

## 2022-08-26 MED ORDER — OXYCODONE-ACETAMINOPHEN 5-325 MG PO TABS: 2.0000 | ORAL_TABLET | ORAL | Status: DC | PRN

## 2022-08-26 MED ORDER — MAGNESIUM SULFATE BOLUS VIA INFUSION
4.0000 g | Freq: Once | INTRAVENOUS | Status: AC
  Administered 2022-08-26: 4 g via INTRAVENOUS
  Filled 2022-08-26: qty 1000

## 2022-08-26 MED ORDER — MAGNESIUM SULFATE 40 GM/1000ML IV SOLN
2.0000 g/h | INTRAVENOUS | Status: DC
  Filled 2022-08-26: qty 1000

## 2022-08-26 MED ORDER — ACETAMINOPHEN 500 MG PO TABS
1000.0000 mg | ORAL_TABLET | Freq: Four times a day (QID) | ORAL | Status: DC | PRN
  Administered 2022-08-26: 1000 mg via ORAL
  Filled 2022-08-26: qty 2

## 2022-08-26 MED ORDER — LIDOCAINE HCL (PF) 1 % IJ SOLN: 30.0000 mL | INTRAMUSCULAR | Status: DC | PRN

## 2022-08-26 MED ORDER — HYDRALAZINE HCL 20 MG/ML IJ SOLN
10.0000 mg | INTRAMUSCULAR | Status: DC | PRN
  Administered 2022-08-26: 10 mg via INTRAVENOUS

## 2022-08-26 MED ORDER — SODIUM CHLORIDE 0.9% IV SOLUTION: Freq: Once | INTRAVENOUS | Status: DC

## 2022-08-26 MED ORDER — LACTATED RINGERS IV SOLN
500.0000 mL | INTRAVENOUS | Status: DC | PRN
  Administered 2022-08-26: 500 mL via INTRAVENOUS

## 2022-08-26 MED ORDER — LACTATED RINGERS IV SOLN: INTRAVENOUS | Status: DC

## 2022-08-26 MED ORDER — OXYTOCIN-SODIUM CHLORIDE 30-0.9 UT/500ML-% IV SOLN
1.0000 m[IU]/min | INTRAVENOUS | Status: DC
  Administered 2022-08-26: 1 m[IU]/min via INTRAVENOUS
  Filled 2022-08-26: qty 500

## 2022-08-26 MED ORDER — OXYTOCIN-SODIUM CHLORIDE 30-0.9 UT/500ML-% IV SOLN: 2.5000 [IU]/h | INTRAVENOUS | Status: DC

## 2022-08-26 MED ORDER — DEXTROSE IN LACTATED RINGERS 5 % IV SOLN: INTRAVENOUS | Status: DC

## 2022-08-26 MED ORDER — TERBUTALINE SULFATE 1 MG/ML IJ SOLN: 0.2500 mg | Freq: Once | INTRAMUSCULAR | Status: DC | PRN

## 2022-08-26 MED ORDER — OXYTOCIN BOLUS FROM INFUSION: 333.0000 mL | Freq: Once | INTRAVENOUS | Status: DC

## 2022-08-26 MED ORDER — SOD CITRATE-CITRIC ACID 500-334 MG/5ML PO SOLN
30.0000 mL | ORAL | Status: DC | PRN
  Administered 2022-08-27: 30 mL via ORAL
  Filled 2022-08-26: qty 30

## 2022-08-26 MED ORDER — HYDRALAZINE HCL 20 MG/ML IJ SOLN
5.0000 mg | INTRAMUSCULAR | Status: DC | PRN
  Administered 2022-08-26: 5 mg via INTRAVENOUS
  Filled 2022-08-26: qty 1

## 2022-08-26 MED ORDER — ONDANSETRON HCL 4 MG/2ML IJ SOLN: 4.0000 mg | Freq: Four times a day (QID) | INTRAMUSCULAR | Status: DC | PRN

## 2022-08-26 MED ORDER — OXYCODONE-ACETAMINOPHEN 5-325 MG PO TABS: 1.0000 | ORAL_TABLET | ORAL | Status: DC | PRN

## 2022-08-26 MED ORDER — LABETALOL HCL 5 MG/ML IV SOLN: 20.0000 mg | INTRAVENOUS | Status: DC | PRN

## 2022-08-26 NOTE — Progress Notes (Signed)
Called to bedside to review tracing. CNM at bedside and pt's room if filled with dense tobacco odor Periodic decrease in variability and variables. Surveillance improved with maternal position change and IV fluids. FHR surveillance reviewed with Dr. Charlesetta Garibaldi and plan of care developed.   Subjective:    Perceives active fetal movement. C/O mild chest heaviness on right side, denies labored breathing.   Objective:    VS: BP (!) 160/79 (BP Location: Right Arm)   Pulse (!) 52   Temp 97.8 F (36.6 C) (Oral)   Resp 18   Ht $R'5\' 6"'Ws$  (1.676 m)   Wt 98 kg   SpO2 99%   BMI 34.87 kg/m  HRR, pulse strong Resp unlabored FHR : baseline 155 / variability minimal to moderate / accelerations absent / variable decelerations Toco: contractions every 3-5 minutes  Membranes: intact   Assessment/Plan:   31 y.o. G1P0 [redacted]w[redacted]d  IUGR 2.4%ile and AC <1%    -BPP and dopplers @ 0700  Cat II, returns to Cat I with interventions  GHTN    -normotensive to mild range BP  Change IV fluids from LR to D5LR T&S 2 units PRBC  Plan developed with Dr. Jethro Bastos DNP, CNM 08/26/2022 4:56 AM

## 2022-08-26 NOTE — Progress Notes (Addendum)
Alexandra Steele is a 31 y.o. G1P0 at [redacted]w[redacted]d EGA, Induction of labor due to preeclampsia with severe features,  with IUGR and placenta abruption,  Subjective: Patient reports headache is 4/10 intensity, desires tylenol.  She denies vision changes/chest pain/shortness of breath/nausea/vomiting.  She denies contractions or vaginal bleeding or leakage of fluid.  Objective: BP 131/83   Pulse 64   Temp 97.7 F (36.5 C) (Axillary)   Resp 17   Ht 5\' 6"  (1.676 m)   Wt 98 kg   SpO2 100%   BMI 34.87 kg/m  No intake/output data recorded. Total I/O In: 3312.6 [P.O.:1070; I.V.:2242.6] Out: 700 [Urine:700]    08/26/2022    6:02 PM 08/26/2022    5:08 PM 08/26/2022    4:06 PM  Vitals with BMI  Systolic 131 138 10/26/2022  Diastolic 83 91 70  Pulse 64 70 63    FHT:  FHR: 140 bpm, variability: moderate,  accelerations:  Abscent,  decelerations:  Absent UC:   irregular, every 4 to 6 minutes SVE:   Dilation: Closed Effacement (%): 30 Station: -3 Exam by:: [email or web address], MD Foley balloon Placement: Speculum placed in. Betadine applied on cervix with gauze pad.  22 French Foley balloon catheter placed into cervix.  60 cc NS instilled into bulb. Catheter placed under tension with tape to patient's thigh. Patient tolerated the procedure well.   Dr. Sallye Ober.    Labs: Lab Results  Component Value Date   WBC 22.7 (H) 08/26/2022   HGB 11.6 (L) 08/26/2022   HCT 35.5 (L) 08/26/2022   MCV 91.0 08/26/2022   PLT 291 08/26/2022   Assessment / Plan:  31 y/o G1P0 at [redacted] weeks EGA, Induction of labor due to preeclampsia with severe features, with IUGR and placenta abruption,  Labor: Foley balloon placed in, on pitocin, hold pitocin at 84mu/min for cervical ripening.  Preeclampsia:  on magnesium sulfate, will decrease magnesium rate to 1 g/hr due to decreased fetal heart rate variability.  Intake and ouput balanced, and labs stable. Will check preeclampsia labs tomorrow.  Fetal Wellbeing:  Category 2 Pain Control:   Epidural, IV pain meds, and Nitrous Oxide as requested.  I/D:  GBS negative.  Anticipated MOD:  NSVD.  11m, MD 08/26/2022, 6:36 PM

## 2022-08-26 NOTE — Progress Notes (Signed)
Ms Jennette Banker is a 31 yo G1 P0 at 35 weeks who presented yesterday for admission due to [facility] AND IUGR 2%.  She has had a headache that waxes and wanes.   No RUQ pain or epigastric pain.    PMH GERD RH NEG  HIDRADENITIS  ABNORMAL PAP   PREGNANCY COMPLICATED BY     FHTS 150 LTV with accels  Bc RRR

## 2022-08-26 NOTE — H&P (Signed)
Alexandra Steele is 1991/09/18  No LMP recorded. Patient is pregnant. [redacted]w[redacted]d  Pt presents with IUGR (intrauterine growth restriction) affecting care of mother [O36.5990] and chronic abruption.  She has a headache that waxes and wanes.  Good FM.  No bleeding.    Pregnancy complicated by Smoker  Chronic abruption RH neg PMHX  Past Medical History:  Diagnosis Date   Gestational hypertension    Medical history non-contributory    Ovarian cyst    PCOS (polycystic ovarian syndrome)     PSURG HX  Past Surgical History:  Procedure Laterality Date   NO PAST SURGERIES      Fam HX  Family History  Problem Relation Age of Onset   Hypertension Mother    Dementia Mother    Diabetes Paternal Grandmother    Thyroid disease Neg Hx     Soc HX  Social History   Socioeconomic History   Marital status: Single    Spouse name: Not on file   Number of children: Not on file   Years of education: Not on file   Highest education level: Not on file  Occupational History   Not on file  Tobacco Use   Smoking status: Former    Types: Cigarettes    Quit date: 05/07/2017    Years since quitting: 5.3   Smokeless tobacco: Former  Substance and Sexual Activity   Alcohol use: Not Currently    Comment: social   Drug use: Not Currently    Types: Marijuana   Sexual activity: Yes  Other Topics Concern   Not on file  Social History Narrative   Not on file   Social Determinants of Health   Financial Resource Strain: Not on file  Food Insecurity: No Food Insecurity (08/25/2022)   Hunger Vital Sign    Worried About Running Out of Food in the Last Year: Never true    Ran Out of Food in the Last Year: Never true  Transportation Needs: No Transportation Needs (08/25/2022)   PRAPARE - Administrator, Civil Service (Medical): No    Lack of Transportation (Non-Medical): No  Physical Activity: Not on file  Stress: Not on file  Social Connections: Not on file  Intimate Partner Violence:  Not At Risk (08/25/2022)   Humiliation, Afraid, Rape, and Kick questionnaire    Fear of Current or Ex-Partner: No    Emotionally Abused: No    Physically Abused: No    Sexually Abused: No    ROS  Review of Systems - Negative except SEE ABOVE  [place] SIUP VTX position.  QMV78   EFW4#   2%  Gen WDWN  IN NAD CV RRR LUNGS CTAB ABD Gravid soft NT GU l/cl EXT NO CALF TENDERNESS AND 2 PLUS RELEXES A/P: IUP [redacted]w[redacted]d SPIUGR CHRONIC ABRUPTION GHTN Admit to antepartum DRAW PIH LABS IF FETAL STATUS REASUURING AND LABS STABLE WILL DO DOPPPLERS WITH BPP  NICU CONSULT.  PT UNDERSTANDS NICU IS FULL AND BABY MAY NEED TO BE TRANSFERRED IF SHE NEEDS TO BE DELIVERED  PT S/P BMZ  HEADACHE GOES AWAY WITH TYLENOL  IF BLOOOD PRESSURES INCREASE IN SEVERE RANGE MAY CONSIDER MAG AND DELIVERY  GBS DONE IN OFFICE YESTERDAY  CONSULTED WITH DR Parke Poisson.    LATE ENRTY .  I WAS UNABLE TO LOG IN REMOTELY

## 2022-08-26 NOTE — Progress Notes (Signed)
Called by RN to review tracing Baseline 145/ moderate/ 10x10/ variable Position change and IV fluid bolus ordered FHR re-assuring after interventions  Rhea Pink, DNP, CNM 08/26/2022 12:47 AM

## 2022-08-26 NOTE — Consult Note (Signed)
MFM Notes  Alexandra Steele has been hospitalized due to an IUGR fetus with possible chronic abruption and elevated blood pressures.  Her PIH labs show elevated liver function tests (AST 50, ALT 62).  She had severe range blood pressures this morning in the 160s over high 90s range requiring hydralazine for blood pressure control.  She is also complaining of intermittent headaches.  She has already received a complete course of antenatal corticosteroids.  A BPP performed today was 8/8.  The total AFI was 7.08 cm (low normal range).  Doppler studies of the umbilical arteries performed due to fetal growth restriction showed a normal S/D ratio of 2.97.  There were no signs of absent or reversed end-diastolic flow noted today.  An abnormal appearing anterior placenta with a possible retroplacental blood clot indicating a chronic abruption continues to be noted.  Due to IUGR, her severely elevated blood pressures, and the elevated liver function tests, she most likely is developing preeclampsia.  At her current gestational age, delivery is recommended.  I discussed her case with Dr. Sallye Ober.  The patient will be started on magnesium for maternal seizure prophylaxis.  As the NICU currently has a high census, she will confer with the NICU to determine if the patient needs to be transferred to another hospital with NICU availability for delivery.  Should the NICU have room later today or tomorrow, she should be kept for delivery here at [facility].

## 2022-08-26 NOTE — Progress Notes (Signed)
RN called to patient room at 1946. Pt complaining of pain 10/10 vaginally from foley bulb. RN offered pain medicaiotn & epidural, pt declined & requested RN removed foley bulb. 60cc of water de-inflated from balloon and removed intact by RN @ 1948. Pt stated she would call RN back to room when she was ready for cervix to be checked. Provider notified.

## 2022-08-26 NOTE — Progress Notes (Addendum)
Alexandra Steele is a 31 y.o. G1P0 at [redacted]w[redacted]d EGA admitted for IUGR and gestational HTN, placenta abruption, with preeclampsia with severe features based on severe range blood pressures, headache and elevated LFTs (see MFM consultation notes)   Subjective: Patient is reports she has a headache, 6/10 intensity, declines pain medication at this time time. She denies vision changes, nausea, vomiting or abdominal pain. She denies contractions or vaginal bleeding or leakage of fluid. With gross normal fetal movement.   Objective: BP (!) 168/95   Pulse 60   Temp 98.8 F (37.1 C) (Oral)   Resp 16   Ht 5\' 6"  (1.676 m)   Wt 98 kg   SpO2 99%   BMI 34.87 kg/m  No intake/output data recorded. No intake/output data recorded. General: Appears well, no acute distress.  CVS: s1, s2, RRR Pulmonary: Clear to auscultation bilaterally. Abdomen: Soft, gravid, SGA Extremities: Warm and well perfused, no edema, no calf tenderness. 2+ patellar reflex bilaterally. FHT:  FHR: 120 bpm, variability: moderate,  accelerations:  Present,  decelerations:  Absent UC:   none SVE:    closed/20%/-3.    Labs: Lab Results  Component Value Date   WBC 22.7 (H) 08/26/2022   HGB 11.6 (L) 08/26/2022   HCT 35.5 (L) 08/26/2022   MCV 91.0 08/26/2022   PLT 291 08/26/2022   CMP     Component Value Date/Time   NA 140 08/26/2022 0754   K 3.8 08/26/2022 0754   CL 111 08/26/2022 0754   CO2 22 08/26/2022 0754   GLUCOSE 80 08/26/2022 0754   BUN 11 08/26/2022 0754   CREATININE 0.85 08/26/2022 0754   CALCIUM 8.7 (L) 08/26/2022 0754   PROT 5.8 (L) 08/26/2022 0754   ALBUMIN 2.6 (L) 08/26/2022 0754   AST 50 (H) 08/26/2022 0754   ALT 62 (H) 08/26/2022 0754   ALKPHOS 140 (H) 08/26/2022 0754   BILITOT 0.8 08/26/2022 0754   GFRNONAA >60 08/26/2022 0754    Assessment / Plan: 31 y/o G1P0 at [redacted] weeks EGA,  Induction of labor due to preeclampsia with severe features,  with IUGR and placenta abruption,  Labor:  Discussed with  patient new diagnosis of preeclampsia with severe features and recommendations to proceed with labor induction (discussed with MFM, Dr. 37).  I discussed with patient risks, benefits and alternatives of labor induction including risks of cesarean delivery.  We discussed risks of induction agents including effects on fetal heart beat, contraction pattern and need for close monitoring.  Patient expressed understanding of all this and desired to proceed with the induction.  Start pitocin titration, plan to hold it at 57mu/min for cervical ripening. NICU Neonatologist on call was notified of patient and agrees with patient staying at [facility] for delivery as NICU has space for baby if will be needed.  Preeclampsia:  on magnesium sulfate, no signs or symptoms of toxicity, intake and ouput balanced, and labs stable. Patient received Iv Hydralizine while in [facility] specialty care for severe range BPs in 160s/90s with normalization of subsequent blood pressures.   Fetal Wellbeing:  Category I Pain Control:  Epidural, IV pain meds, and Nitrous Oxide I/D:   GBS negative.  Anticipated MOD:  NSVD.  [facility], MD 08/26/2022, 9:19 AM.

## 2022-08-27 ENCOUNTER — Inpatient Hospital Stay (HOSPITAL_COMMUNITY): Payer: BC Managed Care – PPO | Admitting: Certified Registered"

## 2022-08-27 ENCOUNTER — Encounter (HOSPITAL_COMMUNITY)
Admission: AD | Disposition: A | Discharge status: Home / Self Care | Payer: Self-pay | Source: Home / Self Care | Attending: Obstetrics & Gynecology

## 2022-08-27 ENCOUNTER — Encounter (HOSPITAL_COMMUNITY): Payer: Self-pay | Admitting: Obstetrics & Gynecology

## 2022-08-27 DIAGNOSIS — O365931 Maternal care for other known or suspected poor fetal growth, third trimester, fetus 1: Secondary | ICD-10-CM

## 2022-08-27 DIAGNOSIS — O4593 Premature separation of placenta, unspecified, third trimester: Secondary | ICD-10-CM

## 2022-08-27 DIAGNOSIS — O1414 Severe pre-eclampsia complicating childbirth: Secondary | ICD-10-CM

## 2022-08-27 DIAGNOSIS — Z3A35 35 weeks gestation of pregnancy: Secondary | ICD-10-CM

## 2022-08-27 LAB — COMPREHENSIVE METABOLIC PANEL
ALT: 84 U/L — ABNORMAL HIGH (ref 0–44)
AST: 56 U/L — ABNORMAL HIGH (ref 15–41)
Albumin: 2.8 g/dL — ABNORMAL LOW (ref 3.5–5.0)
Alkaline Phosphatase: 135 U/L — ABNORMAL HIGH (ref 38–126)
Anion gap: 6 (ref 5–15)
BUN: 10 mg/dL (ref 6–20)
CO2: 22 mmol/L (ref 22–32)
Calcium: 7.5 mg/dL — ABNORMAL LOW (ref 8.9–10.3)
Chloride: 107 mmol/L (ref 98–111)
Creatinine, Ser: 0.86 mg/dL (ref 0.44–1.00)
GFR, Estimated: >60 mL/min (ref >60)
Glucose, Bld: 100 mg/dL — ABNORMAL HIGH (ref 70–99)
Potassium: 4.6 mmol/L (ref 3.5–5.1)
Sodium: 135 mmol/L (ref 135–145)
Total Bilirubin: 0.5 mg/dL (ref 0.3–1.2)
Total Protein: 6 g/dL — ABNORMAL LOW (ref 6.5–8.1)

## 2022-08-27 LAB — CBC WITH DIFFERENTIAL/PLATELET
Abs Immature Granulocytes: 1.07 10*3/uL — ABNORMAL HIGH (ref 0.00–0.07)
Basophils Absolute: 0.1 10*3/uL (ref 0.0–0.1)
Basophils Relative: 1 %
Eosinophils Absolute: 0 10*3/uL (ref 0.0–0.5)
Eosinophils Relative: 0 %
HCT: 37.2 % (ref 36.0–46.0)
Hemoglobin: 12.3 g/dL (ref 12.0–15.0)
Immature Granulocytes: 5 %
Lymphocytes Relative: 9 %
Lymphs Abs: 1.9 10*3/uL (ref 0.7–4.0)
MCH: 29.7 pg (ref 26.0–34.0)
MCHC: 33.1 g/dL (ref 30.0–36.0)
MCV: 89.9 fL (ref 80.0–100.0)
Monocytes Absolute: 0.6 10*3/uL (ref 0.1–1.0)
Monocytes Relative: 3 %
Neutro Abs: 18.1 10*3/uL — ABNORMAL HIGH (ref 1.7–7.7)
Neutrophils Relative %: 82 %
Platelets: 272 10*3/uL (ref 150–400)
RBC: 4.14 MIL/uL (ref 3.87–5.11)
RDW: 12 % (ref 11.5–15.5)
WBC: 21.7 10*3/uL — ABNORMAL HIGH (ref 4.0–10.5)
nRBC: 0.7 % — ABNORMAL HIGH (ref 0.0–0.2)

## 2022-08-27 LAB — MAGNESIUM: Magnesium: 5.8 mg/dL — ABNORMAL HIGH (ref 1.7–2.4)

## 2022-08-27 LAB — LACTATE DEHYDROGENASE: LDH: 314 U/L — ABNORMAL HIGH (ref 98–192)

## 2022-08-27 SURGERY — Surgical Case
Anesthesia: Spinal

## 2022-08-27 MED ORDER — STERILE WATER FOR IRRIGATION IR SOLN
Status: DC | PRN
  Administered 2022-08-27: 1

## 2022-08-27 MED ORDER — DEXAMETHASONE SODIUM PHOSPHATE 10 MG/ML IJ SOLN
INTRAMUSCULAR | Status: AC
  Filled 2022-08-27: qty 1

## 2022-08-27 MED ORDER — PRENATAL MULTIVITAMIN CH
1.0000 | ORAL_TABLET | Freq: Every day | ORAL | Status: DC
  Administered 2022-08-27 – 2022-08-30 (×4): 1 via ORAL
  Filled 2022-08-27 (×4): qty 1

## 2022-08-27 MED ORDER — OXYTOCIN-SODIUM CHLORIDE 30-0.9 UT/500ML-% IV SOLN
INTRAVENOUS | Status: DC | PRN
  Administered 2022-08-27: 250 mL/h via INTRAVENOUS

## 2022-08-27 MED ORDER — MORPHINE SULFATE (PF) 0.5 MG/ML IJ SOLN
INTRAMUSCULAR | Status: DC | PRN
  Administered 2022-08-27: 150 ug via INTRATHECAL

## 2022-08-27 MED ORDER — MAGNESIUM SULFATE 40 GM/1000ML IV SOLN
2.0000 g/h | INTRAVENOUS | Status: AC
  Administered 2022-08-27 (×2): 2 g/h via INTRAVENOUS
  Filled 2022-08-27: qty 1000

## 2022-08-27 MED ORDER — FENTANYL CITRATE (PF) 100 MCG/2ML IJ SOLN
INTRAMUSCULAR | Status: DC | PRN
  Administered 2022-08-27: 15 ug via INTRATHECAL

## 2022-08-27 MED ORDER — NALOXONE HCL 4 MG/10ML IJ SOLN: 1.0000 ug/kg/h | INTRAVENOUS | Status: DC | PRN

## 2022-08-27 MED ORDER — DEXAMETHASONE SODIUM PHOSPHATE 10 MG/ML IJ SOLN
INTRAMUSCULAR | Status: DC | PRN
  Administered 2022-08-27: 4 mg via INTRAVENOUS

## 2022-08-27 MED ORDER — BUPIVACAINE IN DEXTROSE 0.75-8.25 % IT SOLN
INTRATHECAL | Status: DC | PRN
  Administered 2022-08-27: 12 mg via INTRATHECAL

## 2022-08-27 MED ORDER — LACTATED RINGERS IV SOLN: INTRAVENOUS | Status: DC

## 2022-08-27 MED ORDER — OXYCODONE HCL 5 MG PO TABS: 5.0000 mg | ORAL_TABLET | ORAL | Status: DC | PRN

## 2022-08-27 MED ORDER — TRANEXAMIC ACID-NACL 1000-0.7 MG/100ML-% IV SOLN
INTRAVENOUS | Status: AC
  Filled 2022-08-27: qty 100

## 2022-08-27 MED ORDER — MENTHOL 3 MG MT LOZG: 1.0000 | LOZENGE | OROMUCOSAL | Status: DC | PRN

## 2022-08-27 MED ORDER — DIBUCAINE (PERIANAL) 1 % EX OINT: 1.0000 | TOPICAL_OINTMENT | CUTANEOUS | Status: DC | PRN

## 2022-08-27 MED ORDER — KETOROLAC TROMETHAMINE 30 MG/ML IJ SOLN
30.0000 mg | Freq: Four times a day (QID) | INTRAMUSCULAR | Status: AC
  Administered 2022-08-27 – 2022-08-28 (×4): 30 mg via INTRAVENOUS
  Filled 2022-08-27 (×4): qty 1

## 2022-08-27 MED ORDER — PHENYLEPHRINE HCL-NACL 20-0.9 MG/250ML-% IV SOLN
INTRAVENOUS | Status: DC | PRN
  Administered 2022-08-27: 30 ug/min via INTRAVENOUS

## 2022-08-27 MED ORDER — ONDANSETRON HCL 4 MG/2ML IJ SOLN: 4.0000 mg | Freq: Once | INTRAMUSCULAR | Status: DC | PRN

## 2022-08-27 MED ORDER — HYDROMORPHONE HCL 1 MG/ML IJ SOLN: 0.2500 mg | INTRAMUSCULAR | Status: DC | PRN

## 2022-08-27 MED ORDER — MAGNESIUM SULFATE 40 GM/1000ML IV SOLN
2.0000 g/h | INTRAVENOUS | Status: AC
  Filled 2022-08-27: qty 1000

## 2022-08-27 MED ORDER — OXYTOCIN-SODIUM CHLORIDE 30-0.9 UT/500ML-% IV SOLN: 2.5000 [IU]/h | INTRAVENOUS | Status: AC

## 2022-08-27 MED ORDER — MAGNESIUM SULFATE 40 GM/1000ML IV SOLN: 2.0000 g/h | INTRAVENOUS | Status: DC

## 2022-08-27 MED ORDER — DEXMEDETOMIDINE HCL IN NACL 200 MCG/50ML IV SOLN
INTRAVENOUS | Status: DC | PRN
  Administered 2022-08-27 (×3): 4 ug via INTRAVENOUS

## 2022-08-27 MED ORDER — MORPHINE SULFATE (PF) 0.5 MG/ML IJ SOLN
INTRAMUSCULAR | Status: AC
  Filled 2022-08-27: qty 10

## 2022-08-27 MED ORDER — DIPHENHYDRAMINE HCL 50 MG/ML IJ SOLN: 12.5000 mg | INTRAMUSCULAR | Status: DC | PRN

## 2022-08-27 MED ORDER — KETOROLAC TROMETHAMINE 30 MG/ML IJ SOLN: 30.0000 mg | Freq: Once | INTRAMUSCULAR | Status: DC | PRN

## 2022-08-27 MED ORDER — HYDROMORPHONE HCL 1 MG/ML IJ SOLN: 0.2000 mg | INTRAMUSCULAR | Status: DC | PRN

## 2022-08-27 MED ORDER — SCOPOLAMINE 1 MG/3DAYS TD PT72
1.0000 | MEDICATED_PATCH | Freq: Once | TRANSDERMAL | Status: AC
  Administered 2022-08-27: 1.5 mg via TRANSDERMAL
  Filled 2022-08-27: qty 1

## 2022-08-27 MED ORDER — KETOROLAC TROMETHAMINE 30 MG/ML IJ SOLN
INTRAMUSCULAR | Status: AC
  Filled 2022-08-27: qty 1

## 2022-08-27 MED ORDER — ZOLPIDEM TARTRATE 5 MG PO TABS: 5.0000 mg | ORAL_TABLET | Freq: Every evening | ORAL | Status: DC | PRN

## 2022-08-27 MED ORDER — SOD CITRATE-CITRIC ACID 500-334 MG/5ML PO SOLN: 30.0000 mL | ORAL | Status: DC

## 2022-08-27 MED ORDER — SIMETHICONE 80 MG PO CHEW
80.0000 mg | CHEWABLE_TABLET | Freq: Three times a day (TID) | ORAL | Status: DC
  Administered 2022-08-27 – 2022-08-31 (×13): 80 mg via ORAL
  Filled 2022-08-27 (×13): qty 1

## 2022-08-27 MED ORDER — NALOXONE HCL 0.4 MG/ML IJ SOLN: 0.4000 mg | INTRAMUSCULAR | Status: DC | PRN

## 2022-08-27 MED ORDER — COCONUT OIL OIL
1.0000 | TOPICAL_OIL | Status: DC | PRN
  Administered 2022-08-28 – 2022-08-31 (×2): 1 via TOPICAL

## 2022-08-27 MED ORDER — OXYCODONE HCL 5 MG PO TABS: 5.0000 mg | ORAL_TABLET | Freq: Once | ORAL | Status: DC | PRN

## 2022-08-27 MED ORDER — ONDANSETRON HCL 4 MG/2ML IJ SOLN
INTRAMUSCULAR | Status: DC | PRN
  Administered 2022-08-27: 4 mg via INTRAVENOUS

## 2022-08-27 MED ORDER — SODIUM CHLORIDE 0.9% FLUSH: 3.0000 mL | INTRAVENOUS | Status: DC | PRN

## 2022-08-27 MED ORDER — SENNOSIDES-DOCUSATE SODIUM 8.6-50 MG PO TABS
2.0000 | ORAL_TABLET | Freq: Every day | ORAL | Status: DC
  Administered 2022-08-28 – 2022-08-31 (×4): 2 via ORAL
  Filled 2022-08-27 (×4): qty 2

## 2022-08-27 MED ORDER — TRANEXAMIC ACID-NACL 1000-0.7 MG/100ML-% IV SOLN: 1000.0000 mg | INTRAVENOUS | Status: DC

## 2022-08-27 MED ORDER — PHENYLEPHRINE 80 MCG/ML (10ML) SYRINGE FOR IV PUSH (FOR BLOOD PRESSURE SUPPORT)
PREFILLED_SYRINGE | INTRAVENOUS | Status: AC
  Filled 2022-08-27: qty 10

## 2022-08-27 MED ORDER — IBUPROFEN 600 MG PO TABS
600.0000 mg | ORAL_TABLET | Freq: Four times a day (QID) | ORAL | Status: DC
  Administered 2022-08-28 – 2022-08-31 (×13): 600 mg via ORAL
  Filled 2022-08-27 (×13): qty 1

## 2022-08-27 MED ORDER — ONDANSETRON HCL 4 MG/2ML IJ SOLN: 4.0000 mg | Freq: Three times a day (TID) | INTRAMUSCULAR | Status: DC | PRN

## 2022-08-27 MED ORDER — FENTANYL CITRATE (PF) 100 MCG/2ML IJ SOLN
INTRAMUSCULAR | Status: AC
  Filled 2022-08-27: qty 2

## 2022-08-27 MED ORDER — SODIUM CHLORIDE 0.9 % IR SOLN
Status: DC | PRN
  Administered 2022-08-27 (×2): 1

## 2022-08-27 MED ORDER — WITCH HAZEL-GLYCERIN EX PADS: 1.0000 | MEDICATED_PAD | CUTANEOUS | Status: DC | PRN

## 2022-08-27 MED ORDER — CEFAZOLIN SODIUM-DEXTROSE 2-4 GM/100ML-% IV SOLN
2.0000 g | INTRAVENOUS | Status: AC
  Administered 2022-08-27: 2 g via INTRAVENOUS

## 2022-08-27 MED ORDER — DIPHENHYDRAMINE HCL 25 MG PO CAPS: 25.0000 mg | ORAL_CAPSULE | ORAL | Status: DC | PRN

## 2022-08-27 MED ORDER — OXYCODONE HCL 5 MG/5ML PO SOLN: 5.0000 mg | Freq: Once | ORAL | Status: DC | PRN

## 2022-08-27 MED ORDER — MEPERIDINE HCL 25 MG/ML IJ SOLN: 6.2500 mg | INTRAMUSCULAR | Status: DC | PRN

## 2022-08-27 MED ORDER — ONDANSETRON HCL 4 MG/2ML IJ SOLN
INTRAMUSCULAR | Status: AC
  Filled 2022-08-27: qty 2

## 2022-08-27 MED ORDER — DIPHENHYDRAMINE HCL 25 MG PO CAPS: 25.0000 mg | ORAL_CAPSULE | Freq: Four times a day (QID) | ORAL | Status: DC | PRN

## 2022-08-27 MED ORDER — TRANEXAMIC ACID-NACL 1000-0.7 MG/100ML-% IV SOLN
INTRAVENOUS | Status: DC | PRN
  Administered 2022-08-27: 1000 mg via INTRAVENOUS

## 2022-08-27 MED ORDER — ACETAMINOPHEN 500 MG PO TABS
1000.0000 mg | ORAL_TABLET | Freq: Four times a day (QID) | ORAL | Status: DC
  Administered 2022-08-27 – 2022-08-31 (×17): 1000 mg via ORAL
  Filled 2022-08-27 (×19): qty 2

## 2022-08-27 MED ORDER — SIMETHICONE 80 MG PO CHEW: 80.0000 mg | CHEWABLE_TABLET | ORAL | Status: DC | PRN

## 2022-08-27 MED ORDER — KETOROLAC TROMETHAMINE 30 MG/ML IJ SOLN
30.0000 mg | Freq: Once | INTRAMUSCULAR | Status: AC
  Administered 2022-08-27: 30 mg via INTRAVENOUS

## 2022-08-27 SURGICAL SUPPLY — 37 items
ADH SKN CLS APL DERMABOND .7 (GAUZE/BANDAGES/DRESSINGS)
APL SKNCLS STERI-STRIP NONHPOA (GAUZE/BANDAGES/DRESSINGS) ×1
BENZOIN TINCTURE PRP APPL 2/3 (GAUZE/BANDAGES/DRESSINGS) IMPLANT
CHLORAPREP W/TINT 26ML (MISCELLANEOUS) ×2 IMPLANT
CLAMP CORD UMBIL (MISCELLANEOUS) ×1 IMPLANT
CLOSURE STERI STRIP 1/2 X4 (GAUZE/BANDAGES/DRESSINGS) IMPLANT
CLOTH BEACON ORANGE TIMEOUT ST (SAFETY) ×1 IMPLANT
DERMABOND ADVANCED .7 DNX12 (GAUZE/BANDAGES/DRESSINGS) IMPLANT
DRAPE C SECTION CLR SCREEN (DRAPES) ×1 IMPLANT
DRSG OPSITE POSTOP 4X10 (GAUZE/BANDAGES/DRESSINGS) ×1 IMPLANT
ELECT REM PT RETURN 9FT ADLT (ELECTROSURGICAL) ×1
ELECTRODE REM PT RTRN 9FT ADLT (ELECTROSURGICAL) ×1 IMPLANT
EXTRACTOR VACUUM M CUP 4 TUBE (SUCTIONS) IMPLANT
GLOVE BIOGEL PI IND STRL 7.0 (GLOVE) ×2 IMPLANT
GLOVE SURG SS PI 6.5 STRL IVOR (GLOVE) ×1 IMPLANT
GOWN STRL REUS W/TWL LRG LVL3 (GOWN DISPOSABLE) ×2 IMPLANT
KIT ABG SYR 3ML LUER SLIP (SYRINGE) IMPLANT
NDL HYPO 25X5/8 SAFETYGLIDE (NEEDLE) IMPLANT
NEEDLE HYPO 25X5/8 SAFETYGLIDE (NEEDLE) IMPLANT
NS IRRIG 1000ML POUR BTL (IV SOLUTION) ×1 IMPLANT
PACK C SECTION WH (CUSTOM PROCEDURE TRAY) ×1 IMPLANT
PAD OB MATERNITY 4.3X12.25 (PERSONAL CARE ITEMS) ×1 IMPLANT
RTRCTR C-SECT PINK 25CM LRG (MISCELLANEOUS) ×1 IMPLANT
SUT CHROMIC 1 CTX 36 (SUTURE) IMPLANT
SUT CHROMIC 2 0 CT 1 (SUTURE) ×1 IMPLANT
SUT MON AB 4-0 PS1 27 (SUTURE) ×1 IMPLANT
SUT PLAIN 1 NONE 54 (SUTURE) IMPLANT
SUT PLAIN 2 0 (SUTURE)
SUT PLAIN 2 0 XLH (SUTURE) IMPLANT
SUT PLAIN ABS 2-0 CT1 27XMFL (SUTURE) IMPLANT
SUT VIC AB 0 CTX 36 (SUTURE) ×2
SUT VIC AB 0 CTX36XBRD ANBCTRL (SUTURE) ×1 IMPLANT
SUT VIC AB 1 CTX 36 (SUTURE) ×2
SUT VIC AB 1 CTX36XBRD ANBCTRL (SUTURE) ×2 IMPLANT
TOWEL OR 17X24 6PK STRL BLUE (TOWEL DISPOSABLE) ×1 IMPLANT
TRAY FOLEY W/BAG SLVR 14FR LF (SET/KITS/TRAYS/PACK) ×1 IMPLANT
WATER STERILE IRR 1000ML POUR (IV SOLUTION) ×1 IMPLANT

## 2022-08-27 NOTE — Progress Notes (Signed)
I was requested to assist in this patient's c-section on 08/27/22.   An experienced assistant was required given the standard of surgical care given the complexity of the case.  This assistant was needed for exposure, dissection, suctioning, retraction, instrument exchange,  assisting with delivery with administration of fundal pressure, and for overall help during the procedure.  Myrtie Hawk, DO FMOB Fellow, Faculty practice [facility], Center for [facility] 08/27/22  2:10 AM

## 2022-08-27 NOTE — Progress Notes (Signed)
Alexandra Steele is a 31 y.o. G1P0 at [redacted]w[redacted]d, induction of labor for preeclampsia with severe features, also with IUGR and placenta abruption.   Subjective: Patient reports doing well, she feels occasional contractions.    Objective: BP 128/69   Pulse 66   Temp 97.9 F (36.6 C) (Axillary)   Resp 20   Ht 5\' 6"  (1.676 m)   Wt 98 kg   SpO2 100%   BMI 34.87 kg/m  I/O last 3 completed shifts: In: 3432 [P.O.:1070; I.V.:2362] Out: 1000 [Urine:1000] Total I/O In: 1378.2 [P.O.:200; I.V.:1178.2] Out: 700 [Urine:300; Stool:400]  FHT:  FHR: 130 bpm, variability: minimal ,  accelerations:  Abscent,  decelerations:  Present Late decelerations.  UC:   irregular, every 4 to 6 minutes SVE:   Dilation: 1.5 Effacement (%): Thick Station: -3 Exam by:: Dr. [email or web address]  Labs: Lab Results  Component Value Date   WBC 22.7 (H) 08/26/2022   HGB 11.6 (L) 08/26/2022   HCT 35.5 (L) 08/26/2022   MCV 91.0 08/26/2022   PLT 291 08/26/2022   08/26/22: Magnesium level 5.3   Assessment / Plan: 31 y.o. G1P0 at [redacted]w[redacted]d, induction of labor for preeclampsia with severe features, also with IUGR and placenta abruption.  - Recurrent late decelerations and minimal variability noted with pitocin at 7 mu/minute.  Patient received IV fluid bolus, positional changes and pitocin was halved and with continued abnormal fetal heart tracing.  Unable to continue with pitocin titration due to concern for fetal status. Management options discussed with recommendations for a cesarean delivery and patient is agreeable.  We discussed risks, benefits and alternatives of cesarean section  including but not limited to risks of bleeding, infection, damage to organs and possible need for additional procedures.  All her questions were answered and she was consented for the procedure.  [redacted]w[redacted]d, MD 08/27/2022, 12:37 AM

## 2022-08-27 NOTE — Op Note (Addendum)
Patient: Alexandra Steele DOB: 04-11-1991 MRN:  [id number]  DATE OF SURGERY: 08/27/2022   PREOP DIAGNOSIS: 1. 35 week 1 day EGA IUP. 2. Abnormal fetal heart tracing with recurrent late decelerations and minimal variability.  3. Induction of labor for preeclampsia with severe features. 4. Intrauterine growth restriction. 5. Placenta abruption.  6. Inability of fetus to tolerate labor induction. 7. Abnormal fetal heart tracing with recurrent late decelerations and minimal variability.  8. Remote from delivery at 1.5 cm dilation.   POSTOP DIAGNOSIS: Same as above.  PROCEDURE: Primary low uterine segment transverse cesarean section via Pfannenstiel incision.     SURGEON: Dr.  Hoover Browns  ASSISTANT: Myrtie Hawk, DO  SURGEON ATTESTATION: An experienced assistant was required given the standard of surgical care given the complexity of the case.  This assistant was needed for exposure, dissection, suctioning, retraction, instrument exchange,  assisting with delivery with administration of fundal pressure, and for overall help during the procedure.   ANESTHESIA: Spinal  COMPLICATIONS: None  FINDINGS: Viable female infant in cephalic presentation, DOA, weight 4 lbs 3.4 oz, Apgar scores of 9 and 10. Normal uterus and fallopian tubes and ovaries bilaterally. Small calcified placenta with a 5 cm retroplacental blood clot.    EBL:  446 cc  IV FLUID:   1000 cc LR   URINE OUTPUT: 100 cc clear urine  INDICATIONS:  31 y/o G1P0 at 35 weeks 1 day who presented for induction of labor for preeclampsia with severe features.  Recurrent late fetal decelerations and minimal variability were noted with pitocin induction which continued despite intrauterine resuscitation.   A cesarean delivery was called for abnormal fetal heart tracing and remote from delivery.  She was consented for the procedure after explaining risks benefits and alternatives of the procedure.    PROCEDURE:   She was taken to  the operating room where her spinal anesthesia was found to be adequate. She was prepped and draped in the usual sterile fashion and a Foley catheter was placed. She received 2 g of IV Ancef preoperatively and 1 gram TXA at incision time.  A Pfannenstiel incision was made with the scalpel and the incision extended through the subcutaneous layer and also the fascia with the bovie. Small perforators in the subcutaneous layer were contained with the Bovie. The fascia was nicked in the midline and then was further separated from the rectus muscles bilaterally using Mayo scissors. Kochers were placed inferiorly and then superiorly to allow further separation of fascia from the rectus muscles.  Small bleeders on the rectus muscle were contained with the bovie.  The peritoneal cavity was entered bluntly with the fingers. The Alexis retractor was placed in. The bladder flap was created using Metzenbaum scissors.   The uterus was incised with a scalpel and the incision extended bluntly bilaterally with fingers and bandage scisors. Small clear amniotic fluid was noted.  Membranes were ruptured and the head then the rest of the body was then delivered with abdominal pressure, position of baby was DOA and double nuchal cord was reduced before delivery.  She delivered a viable female infant  The cord was clamped and cut after 1 minute. Cord blood was collected.    The uterus was not exteriorized.  The edges of the uterus was grasped with T clamps. The placenta was delivered with gentle traction on the umbilical cord. The uterus was cleared of clots and debris with a lap.  The uterine incision was closed with #1 Vicryl in a running locked  stitch. An imbricating layer of the same stitch was placed over the initial closure.  Irrigation was applied and suctioned out. Excellent hemostasis was noted over the incision.  The peritoneum was then reapproximated using chromic suture.  Fascia was closed using 0 Vicryl in a running  stitch. The subcutaneous layer was irrigated and suctioned out. Small perforators were contained with the bovie.  The subcutaneous layer was closed using 2-0 plain in interrupted stitches. The skin was closed using 4-0 Vicryl on the Mayland needle. Benzocaine and steri strips were applied.  Honeycomb was then applied. The patient was then cleaned and she was taken to the recovery room with her baby in stable conditions.   SPECIMEN: Placenta to pathology, umbilical cord blood to lab.   DISPOSITION: TO PACU, STABLE.   Dr. Hoover Browns. 08/27/2022.

## 2022-08-27 NOTE — Anesthesia Preprocedure Evaluation (Addendum)
Anesthesia Evaluation  Patient identified by MRN, date of birth, ID band Patient awake    Reviewed: Allergy & Precautions, NPO status , Patient's Chart, lab work & pertinent test results  Airway Mallampati: II  TM Distance: >3 FB Neck ROM: Full    Dental no notable dental hx. (+) Teeth Intact, Dental Advisory Given   Pulmonary neg pulmonary ROS, former smoker,    Pulmonary exam normal breath sounds clear to auscultation       Cardiovascular hypertension (pre E on Mg++), Normal cardiovascular exam Rhythm:Regular Rate:Normal     Neuro/Psych    GI/Hepatic negative GI ROS, Neg liver ROS,   Endo/Other  negative endocrine ROS  Renal/GU negative Renal ROS     Musculoskeletal   Abdominal   Peds  Hematology Lab Results      Component                Value               Date                      WBC                      22.7 (H)            08/26/2022                HGB                      11.6 (L)            08/26/2022                HCT                      35.5 (L)            08/26/2022                MCV                      91.0                08/26/2022                PLT                      291                 08/26/2022              Anesthesia Other Findings   Reproductive/Obstetrics (+) Pregnancy                             Anesthesia Physical Anesthesia Plan  ASA: 3  Anesthesia Plan: Spinal   Post-op Pain Management: Regional block* and Toradol IV (intra-op)*   Induction:   PONV Risk Score and Plan: 3 and Treatment may vary due to age or medical condition, Ondansetron and Dexamethasone  Airway Management Planned: Nasal Cannula, Natural Airway and Simple Face Mask  Additional Equipment: None  Intra-op Plan:   Post-operative Plan:   Informed Consent: I have reviewed the patients History and Physical, chart, labs and discussed the procedure including the risks, benefits and  alternatives for the proposed anesthesia with the patient or authorized representative who has indicated his/her understanding and  acceptance.     Dental advisory given  Plan Discussed with:   Anesthesia Plan Comments: (35.1 wk primagravida w Pre E on Mg++ for C/S)        Anesthesia Quick Evaluation

## 2022-08-27 NOTE — Anesthesia Postprocedure Evaluation (Signed)
Anesthesia Post Note  Patient: Alexandra Steele  Procedure(s) Performed: CESAREAN SECTION     Patient location during evaluation: Mother Baby Anesthesia Type: Spinal Level of consciousness: oriented and awake and alert Pain management: pain level controlled Vital Signs Assessment: post-procedure vital signs reviewed and stable Respiratory status: spontaneous breathing and respiratory function stable Cardiovascular status: blood pressure returned to baseline and stable Postop Assessment: no headache, no backache, no apparent nausea or vomiting and able to ambulate Anesthetic complications: no   No notable events documented.  Last Vitals:  Vitals:   08/27/22 0425 08/27/22 0500  BP: (!) 141/92 128/79  Pulse: (!) 56 (!) 57  Resp: 20 20  Temp: 36.6 C   SpO2: 100% 99%    Last Pain:  Vitals:   08/27/22 0425  TempSrc: Oral  PainSc: 0-No pain   Pain Goal: Patients Stated Pain Goal: 0 (08/27/22 0300)                 Trevor Iha

## 2022-08-27 NOTE — Anesthesia Procedure Notes (Signed)
Spinal  Patient location during procedure: OB Start time: 08/27/2022 12:46 AM End time: 08/27/2022 12:49 AM Reason for block: surgical anesthesia Staffing Performed: anesthesiologist  Anesthesiologist: Trevor Iha, MD Performed by: Trevor Iha, MD Authorized by: Trevor Iha, MD   Preanesthetic Checklist Completed: patient identified, IV checked, risks and benefits discussed, surgical consent, monitors and equipment checked, pre-op evaluation and timeout performed Spinal Block Patient position: sitting Prep: DuraPrep and site prepped and draped Patient monitoring: heart rate, cardiac monitor, continuous pulse ox and blood pressure Approach: midline Location: L3-4 Injection technique: single-shot Needle Needle type: Pencan  Needle gauge: 24 G Needle length: 10 cm Needle insertion depth: 7 cm Assessment Sensory level: T4 Events: CSF return Additional Notes 1 Attempt (s). Pt tolerated procedure well.

## 2022-08-28 ENCOUNTER — Ambulatory Visit: Payer: BC Managed Care – PPO

## 2022-08-28 LAB — COMPREHENSIVE METABOLIC PANEL
ALT: 44 U/L (ref 0–44)
AST: 27 U/L (ref 15–41)
Albumin: 2.3 g/dL — ABNORMAL LOW (ref 3.5–5.0)
Alkaline Phosphatase: 112 U/L (ref 38–126)
Anion gap: 6 (ref 5–15)
BUN: 16 mg/dL (ref 6–20)
CO2: 26 mmol/L (ref 22–32)
Calcium: 6.7 mg/dL — ABNORMAL LOW (ref 8.9–10.3)
Chloride: 106 mmol/L (ref 98–111)
Creatinine, Ser: 1 mg/dL (ref 0.44–1.00)
GFR, Estimated: >60 mL/min (ref >60)
Glucose, Bld: 91 mg/dL (ref 70–99)
Potassium: 4.5 mmol/L (ref 3.5–5.1)
Sodium: 138 mmol/L (ref 135–145)
Total Bilirubin: 0.6 mg/dL (ref 0.3–1.2)
Total Protein: 5 g/dL — ABNORMAL LOW (ref 6.5–8.1)

## 2022-08-28 LAB — CBC
HCT: 31 % — ABNORMAL LOW (ref 36.0–46.0)
Hemoglobin: 10.4 g/dL — ABNORMAL LOW (ref 12.0–15.0)
MCH: 30.1 pg (ref 26.0–34.0)
MCHC: 33.5 g/dL (ref 30.0–36.0)
MCV: 89.6 fL (ref 80.0–100.0)
Platelets: 236 10*3/uL (ref 150–400)
RBC: 3.46 MIL/uL — ABNORMAL LOW (ref 3.87–5.11)
RDW: 12 % (ref 11.5–15.5)
WBC: 27.2 10*3/uL — ABNORMAL HIGH (ref 4.0–10.5)
nRBC: 0.3 % — ABNORMAL HIGH (ref 0.0–0.2)

## 2022-08-28 LAB — SURGICAL PATHOLOGY

## 2022-08-28 MED ORDER — RHO D IMMUNE GLOBULIN 1500 UNIT/2ML IJ SOSY
300.0000 ug | PREFILLED_SYRINGE | Freq: Once | INTRAMUSCULAR | Status: AC
  Administered 2022-08-28: 300 ug via INTRAMUSCULAR
  Filled 2022-08-28: qty 2

## 2022-08-28 NOTE — Lactation Note (Addendum)
This note was copied from a baby'Steele chart.  NICU Lactation Consultation Note  Patient Name: Alexandra Steele [id number] Date: 08/28/2022 Age:31 years  Subjective Reason for consult: Initial assessment; 1st time breastfeeding; Primapara; Late-preterm 34-36.6wks; Infant < 6lbs; NICU baby; Other (Comment) (SGA, smoker)  Visited with family of 52 hours old LPI NICU female, [name] is a P1 and reports that she'Steele already getting volume when she pumps, praised her for her efforts. Reviewed lactogenesis II, pumping schedule, benefits of premature milk for NICU babies and anticipatory guidelines. Noticed she was using Vit E oil for breast care; advised to use a food grade oil instead such as coconut oil; it was provided by her RN Herbert Seta P.  Objective Infant data: Mother'Steele Current Feeding Choice: Breast Milk and Donor Milk  Infant feeding assessment Scale for Readiness: 3 Scale for Quality: 3  Maternal data: G1P0  C-Section, Low Transverse Significant Breast History:: (+) breast changes Current breast feeding challenges:: NICU admission Does the patient have breastfeeding experience prior to this delivery?: No Pumping frequency: 3 times/24 hours Pumped volume: 11 mL Flange Size: 24 Risk factor for low milk supply:: primipara, prematurity, infant separation Pump: Personal (Hand free pump)  Assessment Infant: Feeding Status: Ad lib  Maternal: Milk volume: Normal  Intervention/Plan Interventions: Breast feeding basics reviewed; DEBP; Education; LC Services brochure Tools: Pump; Flanges Pump Education: Setup, frequency, and cleaning; Milk Storage  Plan of care: Encouraged to pump every 3 hours; ideally 8 pumping sessions/24 hours Hand expression, breast massage and coconut oil were also encouraged prior pumping  FOB present and very supportive. All questions and concerns answered, family to contact [facility] services PRN.  Consult Status: NICU follow-up  NICU Follow-up type: New  admission follow up; Maternal D/C visit; Verify onset of copious milk; Verify absence of engorgement   Alexandra Steele Alexandra Steele 08/28/2022, 12:03 PM

## 2022-08-28 NOTE — Clinical Social Work Maternal (Signed)
CLINICAL SOCIAL WORK MATERNAL/CHILD NOTE  Patient Details  Name: Alexandra Steele MRN: [id number] Date of Birth: 04/02/1991  Date:  05-11-22  Clinical Social Worker Initiating Note:  Abundio Miu, [place] Date/Time: Initiated:  08/28/22/1426     Child's Name:  Phoebe Perch III "TJ"  Biological Parents:  Mother, Father (Father: Xane Amsden.)   Need for Interpreter:  None   Reason for Referral:  Other (Comment), Behavioral Health Concerns (Infant's NICU Admission)   Address:  [place] [address]    Phone number:  [phone] (home)     Additional phone number:   Household Members/Support Persons (HM/SP):   Household Member/Support Person 1, Household Member/Support Person 2   HM/SP Name Relationship DOB or Age  HM/SP -[place]. FOB    HM/SP -2 Vanessa Ralphs daughter 12 years old  HM/SP -3        HM/SP -4        HM/SP -5        HM/SP -6        HM/SP -7        HM/SP -8          Natural Supports (not living in the home):  Immediate Family   Professional Supports: None   Employment: Animator   Type of Work: Industrial/product designer   Education:  Occidental arranged:    Museum/gallery curator Resources:  Multimedia programmer    Other Resources:      Cultural/Religious Considerations Which May Impact Care:    Strengths:  Ability to meet basic needs  , Engineer, materials, Home prepared for child  , Understanding of illness   Psychotropic Medications:         Pediatrician:    [place] (including [place])  Pediatrician List:   [place] Other [facility]  [address],  [place], [place] [address]  [phone])    Pediatrician Fax Number:    Risk Factors/Current Problems:  Basic Needs  , Mental Health Concerns     Cognitive State:  Able to Concentrate  , Insightful  , Goal  Oriented  , Linear Thinking  , Alert     Mood/Affect:  Comfortable  , Calm  , Happy  , Relaxed  , Interested     CSW Assessment: CSW met with MOB at bedside to complete psychosocial assessment, FOB present. CSW introduced self and explained role. MOB was welcoming, pleasant, open, and remained engaged during assessment. MOB reported that she resides with FOB and daughter. 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 MOB reported that she is interested in [facility] and food stamps. CSW agreed to complete a [facility] referral, MOB agreeable to referral. CSW agreed to provide MOB with the link to apply for food stamps. MOB reported that they have needed items to care for infant including a car seat and basinet. CSW informed parents about [facility] [employer] if any assistance is needed obtaining items for infant. MOB reported that a referral for essential items would be helpful, CSW agreed to make a referral. CSW inquired about MOB's support system, MOB reported that FOB, her mom, and FOB's mom are supports.   CSW provided review of Sudden Infant Death Syndrome (SIDS) precautions.    CSW and parents discussed infant's NICU admission.  CSW informed parents about the NICU, what to expect, and resources/supports available during NICU admission. FOB shared that they feel well informed for the most part. Parents denied any transportation barriers with visiting infant in the NICU. Parents denied any questions/concerns regarding the NICU.   CSW asked to speak with MOB privately, FOB left the room.   CSW inquired about MOB's mental health history. MOB shared that she has a family history of Bipolar and disclosed she experienced suicidal thoughts while taking birth control prior to pregnancy. MOB reported that she also had a plan during that time but no intention to carry out the plan/commit suicide. CSW assessed for safety, MOB denied SI, HI, and domestic violence. MOB  reported that she only experienced suicidal thoughts while on birth control. MOB reported that she has not been formally diagnosed with anxiety but has experienced anxiety since 2015. MOB described her anxiety as becoming overwhelmed then shaking and crying. MOB endorsed a history of panic attacks and reported that her first panic attack was in 2013. MOB shared about her triggers. CSW inquired about MOB's treatment, MOB reported that she is not taking any medication nor participating in therapy at this time. CSW asked if MOB was interested in therapy resources, MOB reported yes. CSW agreed to provide. CSW inquired about MOB's coping skills, MOB reported that she talks to her mom, prays, and works to cope. CSW inquired about how MOB was feeling emotionally since giving birth, MOB reported that she was feeling in awe and as if this is surreal moment. CSW and MOB discussed stress reduction and ways to care for self during a NICU admission as it can provoke feelings of anxiety at times. MOB presented calm and did not demonstrate any acute mental health signs/symptoms.   CSW provided education regarding the baby blues period vs. perinatal mood disorders, discussed treatment and gave resources for mental health follow up if concerns arise.  CSW recommends self-evaluation during the postpartum time period using the New Mom Checklist from Postpartum Progress and encouraged MOB to contact a medical professional if symptoms are noted at any time.    CSW placed 4 meal vouchers and therapy resource at infant's bedside.   CSW completed WIC referral.   CSW completed FSN referral for requested items.   CSW identifies no further need for intervention and no barriers to discharge at this time. MOB opted to call CSW if any needs arise versus CSW checking in weekly.    CSW Plan/Description:  Sudden Infant Death Syndrome (SIDS) Education, Perinatal Mood and Anxiety Disorder (PMADs) Education, No Further Intervention  Required/No Barriers to Discharge, Other Patient/Family Education, Other Information/Referral to Community Resources    Kalyna Paolella L Adriona Kaney, LCSW 08/28/2022, 2:33 PM 

## 2022-08-28 NOTE — Progress Notes (Signed)
Subjective: Postpartum Day 1: Cesarean Delivery Patient reports tolerating PO.  Feels much better now that she is off mg.  Lactation consultant in room instructing pt.  Objective: Vital signs in last 24 hours: Temp:  [97.7 F (36.5 C)-98.6 F (37 C)] 98 F (36.7 C) (09/14 0738) Pulse Rate:  [65-74] 74 (09/14 0738) Resp:  [16-20] 18 (09/14 0738) BP: (126-139)/(68-88) 132/75 (09/14 0738) SpO2:  [99 %-100 %] 100 % (09/14 0418)  Physical Exam:  General: alert and no distress Lochia: appropriate Uterine Fundus: firm, NT Incision: dressing with minimal outlined staining, no active bleeding DVT Evaluation: no calf tenderness  Recent Labs    08/27/22 0715 08/28/22 0617  HGB 12.3 10.4*  HCT 37.2 31.0*    Assessment/Plan: Status post Cesarean section. Doing well postoperatively. S/p Mg for Pre-Eclampsia with severe features Continue current care. BPs under control Voiding spontaneously - strict I's/O's LFTs have normalized Creatinine 1.00 wnl but slight increase, will recheck in am WBC 27, will recheck CBC in am with BMP SCDs for DVT prophylaxis Encourage IS  Purcell Nails, MD 08/28/2022, 11:47 AM

## 2022-08-29 DIAGNOSIS — Z6791 Unspecified blood type, Rh negative: Secondary | ICD-10-CM | POA: Diagnosis present

## 2022-08-29 LAB — BPAM RBC
Blood Product Expiration Date: 202309232359
Blood Product Expiration Date: 202309242359
Unit Type and Rh: 9500
Unit Type and Rh: 9500

## 2022-08-29 LAB — RH IG WORKUP (INCLUDES ABO/RH)
ABO/RH(D): O NEG
Fetal Screen: NEGATIVE
Gestational Age(Wks): 35
Unit division: 0

## 2022-08-29 LAB — CBC
HCT: 30.1 % — ABNORMAL LOW (ref 36.0–46.0)
Hemoglobin: 9.7 g/dL — ABNORMAL LOW (ref 12.0–15.0)
MCH: 29.2 pg (ref 26.0–34.0)
MCHC: 32.2 g/dL (ref 30.0–36.0)
MCV: 90.7 fL (ref 80.0–100.0)
Platelets: 227 10*3/uL (ref 150–400)
RBC: 3.32 MIL/uL — ABNORMAL LOW (ref 3.87–5.11)
RDW: 12.1 % (ref 11.5–15.5)
WBC: 17.7 10*3/uL — ABNORMAL HIGH (ref 4.0–10.5)
nRBC: 0.1 % (ref 0.0–0.2)

## 2022-08-29 LAB — TYPE AND SCREEN
ABO/RH(D): O NEG
Antibody Screen: POSITIVE
Unit division: 0
Unit division: 0

## 2022-08-29 LAB — BASIC METABOLIC PANEL
Anion gap: 9 (ref 5–15)
BUN: 18 mg/dL (ref 6–20)
CO2: 20 mmol/L — ABNORMAL LOW (ref 22–32)
Calcium: 7.6 mg/dL — ABNORMAL LOW (ref 8.9–10.3)
Chloride: 110 mmol/L (ref 98–111)
Creatinine, Ser: 0.79 mg/dL (ref 0.44–1.00)
GFR, Estimated: >60 mL/min (ref >60)
Glucose, Bld: 71 mg/dL (ref 70–99)
Potassium: 4.8 mmol/L (ref 3.5–5.1)
Sodium: 139 mmol/L (ref 135–145)

## 2022-08-29 NOTE — Progress Notes (Signed)
Subjective: POD# 2 Information for the patient's newborn:  Alexandra Steele, Alexandra Steele [[id number]]  female   Baby's Name "Alexandra Steele" Circumcision before discharge  Reports feeling better, still sore Feeding: bottle Reports tolerating PO and denies N/V, foley removed, ambulating and urinating w/o difficulty  Pain controlled with  PO meds Denies HA/SOB/dizziness  Flatus passing Vaginal bleeding is normal, no clots     Objective:  VS:  Vitals:   08/28/22 2013 08/28/22 2357 08/29/22 0334 08/29/22 0753  BP: (!) 140/79 128/67 124/73 125/65  Pulse: 65 61 66 66  Resp: 17 16 18 17   Temp: 98.1 F (36.7 C) 98.7 F (37.1 C) 98 F (36.7 C) 98.1 F (36.7 C)  TempSrc: Oral Oral Oral Oral  SpO2: 98% 98% 99% 99%  Weight:      Height:        Intake/Output Summary (Last 24 hours) at 08/29/2022 0906 Last data filed at 08/29/2022 0330 Gross per 24 hour  Intake --  Output 1900 ml  Net -1900 ml     Recent Labs    08/28/22 0617 08/29/22 0444  WBC 27.2* 17.7*  HGB 10.4* 9.7*  HCT 31.0* 30.1*  PLT 236 227    Blood type: --/--/O NEG (09/13 0715) Rubella: Immune (03/23 0000)    Physical Exam:  General: alert, cooperative, and no distress CV: trace edema Resp: unlabored Abdomen: soft, nontender, no tympany Incision:  honeycomb dressing is C/D/I Perineum:  Uterine Fundus: firm, below umbilicus, nontender Lochia:  appropriate Ext:  trace edema, negative for pain, tenderness, and cords   Assessment/Plan: 31 y.o.   POD# 2. G1P0                  Principal Problem:   IUGR (intrauterine growth restriction) affecting care of mother Active Problems:   Gestational hypertension w/o significant proteinuria in 3rd trimester   Preeclampsia, severe   Blood type, Rh negative Normotensive Rhogam given 08/28/22  Continue routine post-op PP care          May visit newborn ad lib Breastfeeding support PRN Anticipate D/C 08/30/22   09/01/22, DNP, CNM 08/29/2022, 9:06 AM

## 2022-08-30 LAB — CBC
HCT: 30.6 % — ABNORMAL LOW (ref 36.0–46.0)
Hemoglobin: 9.7 g/dL — ABNORMAL LOW (ref 12.0–15.0)
MCH: 29 pg (ref 26.0–34.0)
MCHC: 31.7 g/dL (ref 30.0–36.0)
MCV: 91.6 fL (ref 80.0–100.0)
Platelets: 247 10*3/uL (ref 150–400)
RBC: 3.34 MIL/uL — ABNORMAL LOW (ref 3.87–5.11)
RDW: 12.1 % (ref 11.5–15.5)
WBC: 14.2 10*3/uL — ABNORMAL HIGH (ref 4.0–10.5)
nRBC: 0 % (ref 0.0–0.2)

## 2022-08-30 MED ORDER — GABAPENTIN 100 MG PO CAPS: 100.0000 mg | ORAL_CAPSULE | Freq: Two times a day (BID) | ORAL | Status: DC | PRN

## 2022-08-30 MED ORDER — NIFEDIPINE ER OSMOTIC RELEASE 30 MG PO TB24
30.0000 mg | ORAL_TABLET | Freq: Every day | ORAL | Status: DC
  Administered 2022-08-30 – 2022-08-31 (×2): 30 mg via ORAL
  Filled 2022-08-30 (×2): qty 1

## 2022-08-30 MED ORDER — OXYCODONE-ACETAMINOPHEN 5-325 MG PO TABS: 1.0000 | ORAL_TABLET | ORAL | Status: DC | PRN

## 2022-08-30 NOTE — Progress Notes (Signed)
S: Soreness at incision site not relieved by current regimen.  O: Blood pressure (!) 146/75, pulse (!) 53, temperature 98.2 F (36.8 C), temperature source Oral, resp. rate 18, height 5\' 6"  (1.676 m), weight 98 kg, SpO2 100 %. CBC    Component Value Date/Time   WBC 14.2 (H) 08/30/2022 0507   RBC 3.34 (L) 08/30/2022 0507   HGB 9.7 (L) 08/30/2022 0507   HCT 30.6 (L) 08/30/2022 0507   PLT 247 08/30/2022 0507   MCV 91.6 08/30/2022 0507   MCH 29.0 08/30/2022 0507   MCHC 31.7 08/30/2022 0507   RDW 12.1 08/30/2022 0507   LYMPHSABS 1.9 08/27/2022 0715   MONOABS 0.6 08/27/2022 0715   EOSABS 0.0 08/27/2022 0715   BASOSABS 0.1 08/27/2022 0715  Phys Exam General: alert, cooperative, and no distress CV: trace edema Resp: unlabored Abdomen: soft, nontender, no tympany Incision:  honeycomb dressing is C/D/I Perineum:  Uterine Fundus: firm, below umbilicus, nontender Lochia:  appropriate Ext:  trace edema, negative for pain, tenderness, and cords  A/P: POD #3 S/P LTCS  Severe pre-eclampsia    -normotensive Elevated wbc    -trending down Pain    -adjusting medication Rh neg    -rec'vd rhogam Burman Foster, DNP, CNM 08/30/2022 11:34 AM

## 2022-08-30 NOTE — Lactation Note (Signed)
This note was copied from a baby's chart.  NICU Lactation Consultation Note  Patient Name: Alexandra Steele ONGEX'B Date: 08/30/2022 Age:29 days   Subjective Reason for consult: Follow-up assessment; Primapara; 1st time breastfeeding; NICU baby  Lactation followed up with Alexandra Steele. She endorses onset of lactogenesis II and showed me a photo of her last pumped volume which was about 2.5 oz. She expressed her happiness to see the transition occurring.  I showed her how to switch to the expression phase of the pump.  Alexandra Steele has appropriate personal pumps at home. She purchased a new one yesterday.  Alexandra Steele states that she would like assistance with breastfeeding. I encouraged her to allow baby to begin process of latching when she places him STS. I also encouraged her to call lactation for assistance on 9/17.  Objective Infant data: Mother's Current Feeding Choice: Breast Milk and Donor Milk  Infant feeding assessment Scale for Readiness: 2 Scale for Quality: 3   Maternal data: G1P0  C-Section, Low Transverse Current breast feeding challenges:: NICU Does the patient have breastfeeding experience prior to this delivery?: No  Pumping frequency: Recommended q3 hours Pumped volume: 70 mL   Pump: Personal (hands free and plug-in pumps)  Assessment Maternal: Milk volume: Normal   Intervention/Plan Interventions: Breast feeding basics reviewed; Education; DEBP  Tools: Pump Pump Education: Setup, frequency, and cleaning  Plan: Consult Status: NICU follow-up  NICU Follow-up type: Weekly NICU follow up; Verify absence of engorgement    Lenore Manner 08/30/2022, 12:25 PM

## 2022-08-31 DIAGNOSIS — Z98891 History of uterine scar from previous surgery: Secondary | ICD-10-CM

## 2022-08-31 DIAGNOSIS — D62 Acute posthemorrhagic anemia: Secondary | ICD-10-CM | POA: Diagnosis not present

## 2022-08-31 DIAGNOSIS — Z3490 Encounter for supervision of normal pregnancy, unspecified, unspecified trimester: Secondary | ICD-10-CM | POA: Diagnosis not present

## 2022-08-31 DIAGNOSIS — O4590 Premature separation of placenta, unspecified, unspecified trimester: Secondary | ICD-10-CM | POA: Diagnosis present

## 2022-08-31 MED ORDER — IBUPROFEN 600 MG PO TABS: 600.0000 mg | ORAL_TABLET | Freq: Four times a day (QID) | ORAL | 0 refills | Status: AC

## 2022-08-31 MED ORDER — NIFEDIPINE ER 30 MG PO TB24: 30.0000 mg | ORAL_TABLET | Freq: Every day | ORAL | 1 refills | Status: AC

## 2022-08-31 MED ORDER — OXYCODONE HCL 5 MG PO TABS: 5.0000 mg | ORAL_TABLET | ORAL | 0 refills | Status: AC | PRN

## 2022-08-31 NOTE — Discharge Summary (Signed)
PCS OB Discharge Summary       Patient Name: Alexandra Steele DOB: 09/06/1991 MRN: [id number]  Date of admission: 08/25/2022 Delivering MD: Hoover Browns  Date of delivery: 08/27/2022 Type of delivery: PCS  Newborn Data: Sex: Baby Female Circumcision: IN NICU but desires in pt prior to discharge Live born female  Birth Weight: 4 lb 3.4 oz (1910 g) APGAR: 9, 10  Newborn Delivery   Birth date/time: 08/27/2022 01:22:00 Delivery type: C-Section, Low Transverse Trial of labor: Yes C-section categorization: Primary      Feeding: breast and bottle Infant being discharge to home with mother in stable condition.   Admitting diagnosis: IUGR (intrauterine growth restriction) affecting care of mother [O36.5990] Preeclampsia, severe [O14.10] Intrauterine pregnancy: [redacted]w[redacted]d     Secondary diagnosis:  Principal Problem:   IUGR (intrauterine growth restriction) affecting care of mother Active Problems:   Gestational hypertension w/o significant proteinuria in 3rd trimester   Preeclampsia, severe   Blood type, Rh negative   Encounter for induction of labor   S/P cesarean section   Normal postpartum course   Acute blood loss anemia   Placental abruption affecting delivery                                Complications: None                                                              Intrapartum Procedures: cesarean: low cervical, transverse Postpartum Procedures: none Complications-Operative and Postpartum: none Augmentation: AROM, Pitocin, and IP Foley   History of Present Illness: Alexandra Steele is a 31 y.o. female, G1P0, who presents at [redacted]w[redacted]d weeks gestation. The patient has been followed at  [facility]  Her pregnancy has been complicated by:  Patient Active Problem List   Diagnosis Date Noted   Encounter for induction of labor 08/31/2022   S/P cesarean section 08/31/2022   Normal postpartum course 08/31/2022   Acute blood loss anemia  08/31/2022   Placental abruption affecting delivery 08/31/2022   Blood type, Rh negative 08/29/2022   Preeclampsia, severe 08/26/2022   IUGR (intrauterine growth restriction) affecting care of mother 08/25/2022   Gestational hypertension w/o significant proteinuria in 3rd trimester 08/25/2022     Active Ambulatory Problems    Diagnosis Date Noted   No Active Ambulatory Problems   Resolved Ambulatory Problems    Diagnosis Date Noted   No Resolved Ambulatory Problems   Past Medical History:  Diagnosis Date   Gestational hypertension    Medical history non-contributory    Ovarian cyst    PCOS (polycystic ovarian syndrome)      Hospital course:  Induction of Labor With Cesarean Section   31 y.o. yo G1P0 at [redacted]w[redacted]d was admitted to the hospital 08/25/2022 for induction of labor. Patient had a labor course significant for  PREOP DIAGNOSIS: 1. 35 week 1 day EGA IUP, addmitted IOL 9/12 for IUGR and PreE with SF. 2. Abnormal fetal heart tracing with recurrent late decelerations and minimal variability.  3. Induction of labor for preeclampsia with severe features. 4. Intrauterine growth restriction. 5. Placenta abruption.  6. Inability of fetus to tolerate labor induction. 7. Abnormal fetal heart tracing with recurrent late  decelerations and minimal variability.  8. Remote from delivery at 1.5 cm dilation.    POSTOP DIAGNOSIS: Same as above.  The patient went for cesarean section due to  see above . Delivery details are as follows: Membrane Rupture Time/Date: 1:21 AM ,08/27/2022   Delivery Method:C-Section, Low Transverse  Details of operation can be found in separate operative Note.  Patient had an uncomplicated postpartum course. She is ambulating, tolerating a regular diet, passing flatus, and urinating well.  Patient is discharged home in stable condition on 08/31/22.      Newborn Data: Birth date:08/27/2022  Birth time:1:22 AM  Gender:Female  Living status:Living  Apgars:9 ,10   Weight:1910 g                               Postpartum PostOp Day # 4 :  Hospital Course--Unscheduled Cesarean:  Admitted 9/12. Negative GBS.  Utilized epidural for pain management.   Primary LTCS @ 35.4 weeks.  Due to NRFHT remote from delivery she was consented for cesarean on 9/13, pt did not progress with pitocin and IP foley with suspected placenta abruption, NRFHT, IUGR, and preE with SR BP with severe features, with Dr. Alesia Richards performing a Primary LTCS under epidural anesthesia, with delivery of a viable baby female went to the NICU, with weight and Apgars as listed below.  The patient was taken to recovery in good condition. Started on and had 24 Hours PP mag, asymptomaitc, pt did revie IV hydralazine in MAU prior to delivery, BP was trending high in 150/80s but pt also in pain started on procardia 30mg  xl on 9/16, current BP is 138/77, asymptomatic, denies HA, RUQ pain or vision changes, will be discharged to home on procardia 30mg  xl daily, with one week BP check, pt AST and ALT trended down, 54-50-56-27/59-62-84-44, and WBC trended down, does not suspect infections 27.2-17.7-14.2. Is RH- and received PP rhogam in pt. Patient planned to pump and bottle feed.  On post-op day 1, patient was doing well, tolerating a regular diet, with Hgb of 11.6-9.7 with QBL of 479mls, asymptomatic, recommended po iron. .  Throughout her stay, her physical exam was WNL, her incision was CDI, and her vital signs remained stable.  By post-op day 1, she was up ad lib, tolerating a regular diet, with good pain control with po med.  She was deemed to have received the full benefit of her hospital stay, and was discharged home in stable condition.  Contraceptive choice was undecided.    Physical exam  Vitals:   08/30/22 1531 08/30/22 1935 08/30/22 2348 08/31/22 0333  BP: (!) 157/85 139/74 133/78 138/77  Pulse: (!) 51 72 82 77  Resp:  16 16 20   Temp:  98.2 F (36.8 C) 98.7 F (37.1 C) 98.2 F (36.8 C)  TempSrc:  Oral  Oral Oral  SpO2:  98% 100% 100%  Weight:      Height:       General: alert, cooperative, and no distress Lochia: appropriate Uterine Fundus: firm Incision: Healing well with no significant drainage, No significant erythema, Dressing is clean, dry, and intact, honeycomb dressing CDI Perineum: intact DVT Evaluation: No evidence of DVT seen on physical exam. Negative Homan's sign. No cords or calf tenderness. No significant calf/ankle edema.  Labs: Lab Results  Component Value Date   WBC 14.2 (H) 08/30/2022   HGB 9.7 (L) 08/30/2022   HCT 30.6 (L) 08/30/2022   MCV 91.6 08/30/2022  PLT 247 08/30/2022      Latest Ref Rng & Units 08/29/2022    4:44 AM  CMP  Glucose 70 - 99 mg/dL 71   BUN 6 - 20 mg/dL 18   Creatinine 1.610.44 - 1.00 mg/dL 0.960.79   Sodium 045135 - 409145 mmol/L 139   Potassium 3.5 - 5.1 mmol/L 4.8   Chloride 98 - 111 mmol/L 110   CO2 22 - 32 mmol/L 20   Calcium 8.9 - 10.3 mg/dL 7.6     Date of discharge: 08/31/2022 Discharge Diagnoses: Term Pregnancy-delivered and Preelampsia Discharge instruction: per After Visit Summary and "Baby and Me Booklet".  After visit meds:   Activity:           unrestricted and pelvic rest Advance as tolerated. Pelvic rest for 6 weeks.  Diet:                routine Medications: PNV, Ibuprofen, Colace, Iron, and procardia, roxy IR Postpartum contraception: Undecided Condition:  Pt discharge to home with baby in stable and condition PreE: 1 week bp check, procardia, monitor for rebound s/sx ABLA: PO iron Baby Female: desires in pt circ prior to discharge from the NICU.   Meds: Allergies as of 08/31/2022   No Known Allergies      Medication List     TAKE these medications    ferrous sulfate 325 (65 FE) MG tablet Take 325 mg by mouth daily with breakfast.   ibuprofen 600 MG tablet Commonly known as: ADVIL Take 1 tablet (600 mg total) by mouth every 6 (six) hours.   NIFEdipine 30 MG 24 hr tablet Commonly known as: ADALAT CC Take  1 tablet (30 mg total) by mouth daily.   ondansetron 4 MG tablet Commonly known as: ZOFRAN Take 4 mg by mouth every 8 (eight) hours as needed for nausea or vomiting.   oxyCODONE 5 MG immediate release tablet Commonly known as: Oxy IR/ROXICODONE Take 1 tablet (5 mg total) by mouth every 4 (four) hours as needed for moderate pain.   pantoprazole 40 MG tablet Commonly known as: PROTONIX Take 40 mg by mouth daily.   prenatal vitamin w/FE, FA 27-1 MG Tabs tablet Take 1 tablet by mouth daily at 12 noon.   Vitamin D (Ergocalciferol) 1.25 MG (50000 UNIT) Caps capsule Commonly known as: DRISDOL TAKE 1 CAPSULE BY MOUTH TWICE A WEEK WITH A MEAL What changed: See the new instructions.               Discharge Care Instructions  (From admission, onward)           Start     Ordered   08/31/22 0000  Discharge wound care:       Comments: Take dressing off on day 5-7 postpartum.  Report increased drainage, redness or warmth. Clean with water, let soap trickle down body. Can leave steri strips on until they fall off or take them off gently at day 10. Keep open to air, clean and dry.   08/31/22 0719            Discharge Follow Up:   Follow-up Information     Hoover BrownsKulwa, Ema, MD. Schedule an appointment as soon as possible for a visit in 1 week(s).   Specialty: Obstetrics and Gynecology [facility] for a blood pressure check and then again in 6 weeks for your postpartum visit. Contact information: [address] [place] [address] [phone]  [facility] CNM, FNP-C, PMHNP-BC  [address]  [place], [place] [address]  Cell: [phone]  Office Phone: [phone] Fax: [phone] 08/31/2022  7:20 AM

## 2022-08-31 NOTE — Lactation Note (Signed)
This note was copied from a baby's chart.  NICU Lactation Consultation Note  Patient Name: Boy Ahmoni Edge WIOXB'D Date: 08/31/2022 Age:29 days   Subjective Reason for consult: Follow-up assessment Janelly is pumping often and without difficulty. She is interested in challenging breastfeeding and will have [place] paged if she is able to come to NICU during a feeding today.   Objective Infant data: Mother's Current Feeding Choice: Breast Milk  Infant feeding assessment Scale for Readiness: 2 Scale for Quality: 4    Maternal data: G1P0  C-Section, Low Transverse  Current breast feeding challenges:: NICU  Does the patient have breastfeeding experience prior to this delivery?: No  Pumping frequency: q3h Pumped volume: 120 mL   Pump: Personal (hands free and plug-in pumps)  Assessment Maternal: Milk volume: Normal   Intervention/Plan Interventions: Tour manager education; Education  Tools: Pump Pump Education: Setup, frequency, and cleaning  Plan: Consult Status: NICU follow-up  NICU Follow-up type: Weekly NICU follow up; Assist with IDF-2 (Mother does not need to pre-pump before breastfeeding)  Continue to pump q3h and bring EBM to NICU. Have LC paged for bf assistance prn.  Gwynne Edinger 08/31/2022, 10:50 AM

## 2022-09-04 ENCOUNTER — Telehealth (HOSPITAL_COMMUNITY): Payer: Self-pay

## 2022-09-04 NOTE — Telephone Encounter (Signed)
Patient reports feeling good. "I took the dressing off today". RN reviewed incision care with patient. Patient declines questions/concerns about her health and healing.  EPDS score is 7.  Sharyn Lull [facility]  09/04/22,1552

## 2022-09-05 ENCOUNTER — Other Ambulatory Visit: Payer: BC Managed Care – PPO

## 2022-09-05 ENCOUNTER — Ambulatory Visit: Payer: BC Managed Care – PPO

## 2022-09-06 ENCOUNTER — Ambulatory Visit: Payer: Self-pay

## 2022-09-06 NOTE — Lactation Note (Signed)
This note was copied from a baby's chart.  NICU Lactation Consultation Note  Patient Name: Alexandra Steele [id number] Date: 09/06/2022 Age:29 days   Subjective Reason for consult: Follow-up assessment; Primapara; NICU baby; Late-preterm 34-36.6wks  Lactation followed up with birth parent and baby TJ. She states that she is pumping consistently and has no questions regarding pumping. She requests nursing pads, which I provided.  Parent states that she attempted to breastfeed TJ twice, but he did not latch. She took that as an indication that baby may feed better with a bottle. She states that her feeding method is open to whatever means that baby wants. We discussed placement of baby STS at feeding time on chest and monitoring for feeding cues. Parent is open to this option and to breastfeeding, if indicated, for baby.  Lactation to follow up with parent and patient in coordination with Speech Therapy.  Objective Infant data: Mother's Current Feeding Choice: Breast Milk  Infant feeding assessment Scale for Readiness: 3   Maternal data: G1P0  C-Section, Low Transverse  Current breast feeding challenges:: NICU   Does the patient have breastfeeding experience prior to this delivery?: No  Pumping frequency: recommended q3 hours Pumped volume: 160 mL   Pump: Personal (hands free and plug-in pumps)  Assessment  Maternal: Milk volume: Normal   Intervention/Plan Interventions: Breast feeding basics reviewed; Education  Pump Education: Setup, frequency, and cleaning  Plan: Consult Status: NICU follow-up  NICU Follow-up type: Weekly NICU follow up    Alexandra Steele 09/06/2022, 3:18 PM

## 2022-09-07 ENCOUNTER — Ambulatory Visit: Payer: Self-pay

## 2022-09-07 NOTE — Lactation Note (Signed)
This note was copied from a baby's chart.  NICU Lactation Consultation Note  Patient Name: Alexandra Steele [id number] Date: 09/07/2022 Age:29 days  Subjective Reason for consult: Follow-up assessment; Mother's request; Primapara; 1st time breastfeeding; NICU baby; Late-preterm 34-36.6wks  Lactation followed up with birth parent upon request and assisted with a breastfeeding attempt. Baby woke and showed some feeding cues at touch time, and I placed him STS on parent's chest. He bobbed his head slightly, but appeared drowsy. Cues were subdued.  To practice, I showed parent how to gently lower baby to a cradle hold on the right breast. I showed her how to hand express her milk and allow baby to lick at the breast. He did some licking and fell asleep.  Parent and RN endorse baby was alert and cueing this morning at the 0900 feeding. Parent would like lactation to follow up on Tuesday to reinforce education. Parent is interested in breast and bottle feeding.   I encouraged her to breastfeed as interested on a "dry" breast.  Objective Infant data: Mother's Current Feeding Choice: Breast Milk  Infant feeding assessment Scale for Readiness: 2  Maternal data: G1P0  C-Section, Low Transverse Current breast feeding challenges:: NICU  Does the patient have breastfeeding experience prior to this delivery?: No  Pumping frequency: q3 hours Pumped volume: 160 mL   Pump: Personal (hands free and plug-in pumps)  Assessment Infant: LATCH Score: 5   Maternal: Milk volume: Normal   Intervention/Plan Interventions: Breast feeding basics reviewed; Skin to skin; Hand express; Support pillows; Position options; Education  Pump Education: Setup, frequency, and cleaning  Plan: Consult Status: NICU follow-up  NICU Follow-up type: Weekly NICU follow up; Assist with IDF-1 (Mother to pre-pump before breastfeeding)    Lenore Manner 09/07/2022, 12:49 PM

## 2022-09-12 ENCOUNTER — Ambulatory Visit: Payer: BC Managed Care – PPO

## 2022-09-12 ENCOUNTER — Other Ambulatory Visit: Payer: BC Managed Care – PPO

## 2022-09-16 ENCOUNTER — Ambulatory Visit: Payer: Self-pay

## 2022-09-16 NOTE — Lactation Note (Signed)
This note was copied from a baby's chart.  NICU Lactation Consultation Note  Patient Name: Alexandra Steele [id number] Date: 09/16/2022 Age:29 wk.o.   Subjective Reason for consult: Follow-up assessment; NICU baby; 1st time breastfeeding  Lactation followed up with parent in NICU room. Parent states that she is consistently pumping and obtaining adequate milk volumes. She generally pumps q3 hours.  She had a note from the milk lab to bring in less milk, so she skipped a pumping session. We discussed the risks of skipping sessions such as clogged milk ducts and engorgement and reduction of milk production.   After lactation worked with parent on 9/24 to position TJ at the breast, she states that she attempted to latch him a few more times on her own. She felt that baby showed lack of interest in the breast, and at this time, she would prefer to focus on bottle feeding as her plan.   I validated her concerns and offered to assist with positioning at the breast upon her request.  Objective Infant data: Mother's Current Feeding Choice: Breast Milk  Infant feeding assessment Scale for Readiness: 2 Scale for Quality: 3  Maternal data: G1P0  C-Section, Low Transverse Current breast feeding challenges:: NICU  Does the patient have breastfeeding experience prior to this delivery?: No  Pumping frequency: q3 hours Pumped volume: 90 mL (90-120)   Pump: Personal (would like to have a [employer] pump)  Assessment  Maternal: Milk volume: Normal   Intervention/Plan Interventions: Breast feeding basics reviewed; Education  Tools: Pump Pump Education: Setup, frequency, and cleaning  Plan: Consult Status: NICU follow-up  NICU Follow-up type: Weekly NICU follow up    Lenore Manner 09/16/2022, 11:15 AM10/02/2022, 11:15 AM

## 2022-09-23 ENCOUNTER — Ambulatory Visit: Payer: Self-pay

## 2022-09-23 NOTE — Lactation Note (Signed)
This note was copied from a baby's chart.  NICU Lactation Consultation Note  Patient Name: Boy Emelyn Roen [id number] Date: 09/23/2022 Age:31 years old.   Subjective Reason for consult: Follow-up assessment; NICU baby; Primapara; 1st time breastfeeding; Late-preterm 6-36.6wks  Lactation provided birth parent with community resources for discharge. Parent has all needed supplies for pumping. I encouraged her to attend our breastfeeding support group.  Objective Infant data: Mother's Current Feeding Choice: Breast Milk  Infant feeding assessment Scale for Readiness: 2 Scale for Quality: 2    Maternal data: G1P0  C-Section, Low Transverse  Pumping frequency: q3 hours Pumped volume: 120 mL (90-180)  Pump: Personal  Assessment Infant: Feeding Status: Ad lib   Maternal: Milk volume: Normal   Intervention/Plan Interventions: Breast feeding basics reviewed; Education; [employer] brochure  Plan: Consult Status: Complete  Lenore Manner 09/23/2022, 2:12 PM
